# Patient Record
Sex: Female | Born: 1940 | Race: White | Hispanic: No | Marital: Married | Smoking: Never smoker
Health system: Southern US, Community
[De-identification: ages and names within clinical notes are randomized; demographics above are authoritative.]

## PROBLEM LIST (undated history)

## (undated) DIAGNOSIS — E039 Hypothyroidism, unspecified: Secondary | ICD-10-CM

## (undated) HISTORY — PX: COLONOSCOPY: SHX174

## (undated) HISTORY — PX: EYE SURGERY: SHX253

---

## 1983-03-01 HISTORY — PX: ABDOMINAL HYSTERECTOMY: SHX81

## 1997-07-18 ENCOUNTER — Other Ambulatory Visit: Admission: RE | Admit: 1997-07-18 | Discharge: 1997-07-18 | Payer: Self-pay | Admitting: *Deleted

## 1998-05-11 ENCOUNTER — Ambulatory Visit (HOSPITAL_COMMUNITY): Admission: RE | Admit: 1998-05-11 | Discharge: 1998-05-11 | Payer: Self-pay | Admitting: Obstetrics & Gynecology

## 1999-05-20 ENCOUNTER — Other Ambulatory Visit: Admission: RE | Admit: 1999-05-20 | Discharge: 1999-05-20 | Payer: Self-pay | Admitting: *Deleted

## 2000-01-25 ENCOUNTER — Encounter: Admission: RE | Admit: 2000-01-25 | Discharge: 2000-01-25 | Payer: Self-pay | Admitting: *Deleted

## 2000-01-25 ENCOUNTER — Encounter: Payer: Self-pay | Admitting: *Deleted

## 2001-01-30 ENCOUNTER — Encounter: Admission: RE | Admit: 2001-01-30 | Discharge: 2001-01-30 | Payer: Self-pay | Admitting: *Deleted

## 2001-01-30 ENCOUNTER — Encounter: Payer: Self-pay | Admitting: *Deleted

## 2001-09-14 ENCOUNTER — Encounter: Payer: Self-pay | Admitting: *Deleted

## 2001-09-14 ENCOUNTER — Encounter: Admission: RE | Admit: 2001-09-14 | Discharge: 2001-09-14 | Payer: Self-pay | Admitting: *Deleted

## 2002-05-23 ENCOUNTER — Encounter: Admission: RE | Admit: 2002-05-23 | Discharge: 2002-05-23 | Payer: Self-pay | Admitting: Internal Medicine

## 2002-05-23 ENCOUNTER — Encounter: Payer: Self-pay | Admitting: Internal Medicine

## 2003-05-27 ENCOUNTER — Encounter: Admission: RE | Admit: 2003-05-27 | Discharge: 2003-05-27 | Payer: Self-pay | Admitting: *Deleted

## 2004-06-29 ENCOUNTER — Encounter: Admission: RE | Admit: 2004-06-29 | Discharge: 2004-06-29 | Payer: Self-pay | Admitting: *Deleted

## 2005-07-19 ENCOUNTER — Encounter: Admission: RE | Admit: 2005-07-19 | Discharge: 2005-07-19 | Payer: Self-pay | Admitting: Emergency Medicine

## 2006-08-14 ENCOUNTER — Encounter: Admission: RE | Admit: 2006-08-14 | Discharge: 2006-08-14 | Payer: Self-pay | Admitting: Obstetrics & Gynecology

## 2006-08-28 ENCOUNTER — Encounter: Admission: RE | Admit: 2006-08-28 | Discharge: 2006-08-28 | Payer: Self-pay | Admitting: Obstetrics & Gynecology

## 2007-10-10 ENCOUNTER — Encounter: Admission: RE | Admit: 2007-10-10 | Discharge: 2007-10-10 | Payer: Self-pay | Admitting: Obstetrics & Gynecology

## 2008-01-11 ENCOUNTER — Encounter: Admission: RE | Admit: 2008-01-11 | Discharge: 2008-01-11 | Payer: Self-pay | Admitting: Obstetrics & Gynecology

## 2008-12-18 ENCOUNTER — Encounter: Admission: RE | Admit: 2008-12-18 | Discharge: 2008-12-18 | Payer: Self-pay | Admitting: Obstetrics & Gynecology

## 2010-01-15 ENCOUNTER — Encounter: Admission: RE | Admit: 2010-01-15 | Discharge: 2010-01-15 | Payer: Self-pay | Admitting: Obstetrics & Gynecology

## 2010-12-27 ENCOUNTER — Other Ambulatory Visit: Payer: Self-pay | Admitting: Internal Medicine

## 2010-12-27 DIAGNOSIS — Z1231 Encounter for screening mammogram for malignant neoplasm of breast: Secondary | ICD-10-CM

## 2011-01-17 ENCOUNTER — Ambulatory Visit
Admission: RE | Admit: 2011-01-17 | Discharge: 2011-01-17 | Disposition: A | Discharge status: Home / Self Care | Payer: Medicare Other | Source: Ambulatory Visit | Attending: Internal Medicine | Admitting: Internal Medicine

## 2011-01-17 DIAGNOSIS — Z1231 Encounter for screening mammogram for malignant neoplasm of breast: Secondary | ICD-10-CM

## 2011-10-18 DIAGNOSIS — L408 Other psoriasis: Secondary | ICD-10-CM | POA: Diagnosis not present

## 2011-10-18 DIAGNOSIS — D485 Neoplasm of uncertain behavior of skin: Secondary | ICD-10-CM | POA: Diagnosis not present

## 2011-10-18 DIAGNOSIS — L820 Inflamed seborrheic keratosis: Secondary | ICD-10-CM | POA: Diagnosis not present

## 2011-12-27 ENCOUNTER — Other Ambulatory Visit: Payer: Self-pay | Admitting: Internal Medicine

## 2011-12-27 DIAGNOSIS — Z1231 Encounter for screening mammogram for malignant neoplasm of breast: Secondary | ICD-10-CM

## 2012-01-19 DIAGNOSIS — H35379 Puckering of macula, unspecified eye: Secondary | ICD-10-CM | POA: Diagnosis not present

## 2012-01-19 DIAGNOSIS — Z961 Presence of intraocular lens: Secondary | ICD-10-CM | POA: Diagnosis not present

## 2012-02-02 ENCOUNTER — Ambulatory Visit
Admission: RE | Admit: 2012-02-02 | Discharge: 2012-02-02 | Disposition: A | Discharge status: Home / Self Care | Payer: Medicare Other | Source: Ambulatory Visit | Attending: Internal Medicine | Admitting: Internal Medicine

## 2012-02-02 DIAGNOSIS — Z1231 Encounter for screening mammogram for malignant neoplasm of breast: Secondary | ICD-10-CM

## 2012-02-06 DIAGNOSIS — Z961 Presence of intraocular lens: Secondary | ICD-10-CM | POA: Diagnosis not present

## 2012-02-06 DIAGNOSIS — H35379 Puckering of macula, unspecified eye: Secondary | ICD-10-CM | POA: Diagnosis not present

## 2012-02-08 DIAGNOSIS — Z0000 Encounter for general adult medical examination without abnormal findings: Secondary | ICD-10-CM | POA: Diagnosis not present

## 2012-02-08 DIAGNOSIS — E785 Hyperlipidemia, unspecified: Secondary | ICD-10-CM | POA: Diagnosis not present

## 2012-02-08 DIAGNOSIS — Z1331 Encounter for screening for depression: Secondary | ICD-10-CM | POA: Diagnosis not present

## 2012-06-18 DIAGNOSIS — Z961 Presence of intraocular lens: Secondary | ICD-10-CM | POA: Diagnosis not present

## 2012-06-18 DIAGNOSIS — H26499 Other secondary cataract, unspecified eye: Secondary | ICD-10-CM | POA: Diagnosis not present

## 2012-06-20 DIAGNOSIS — H26499 Other secondary cataract, unspecified eye: Secondary | ICD-10-CM | POA: Diagnosis not present

## 2012-11-08 DIAGNOSIS — M542 Cervicalgia: Secondary | ICD-10-CM | POA: Diagnosis not present

## 2012-11-08 DIAGNOSIS — M25539 Pain in unspecified wrist: Secondary | ICD-10-CM | POA: Diagnosis not present

## 2012-11-08 DIAGNOSIS — M19039 Primary osteoarthritis, unspecified wrist: Secondary | ICD-10-CM | POA: Diagnosis not present

## 2012-11-08 DIAGNOSIS — M5030 Other cervical disc degeneration, unspecified cervical region: Secondary | ICD-10-CM | POA: Diagnosis not present

## 2012-11-08 DIAGNOSIS — M25519 Pain in unspecified shoulder: Secondary | ICD-10-CM | POA: Diagnosis not present

## 2012-11-14 DIAGNOSIS — M25529 Pain in unspecified elbow: Secondary | ICD-10-CM | POA: Diagnosis not present

## 2012-11-14 DIAGNOSIS — M542 Cervicalgia: Secondary | ICD-10-CM | POA: Diagnosis not present

## 2012-11-16 DIAGNOSIS — M25519 Pain in unspecified shoulder: Secondary | ICD-10-CM | POA: Diagnosis not present

## 2012-11-16 DIAGNOSIS — M25539 Pain in unspecified wrist: Secondary | ICD-10-CM | POA: Diagnosis not present

## 2012-11-16 DIAGNOSIS — IMO0001 Reserved for inherently not codable concepts without codable children: Secondary | ICD-10-CM | POA: Diagnosis not present

## 2012-11-16 DIAGNOSIS — M542 Cervicalgia: Secondary | ICD-10-CM | POA: Diagnosis not present

## 2012-11-19 DIAGNOSIS — M25529 Pain in unspecified elbow: Secondary | ICD-10-CM | POA: Diagnosis not present

## 2012-11-19 DIAGNOSIS — M542 Cervicalgia: Secondary | ICD-10-CM | POA: Diagnosis not present

## 2012-11-21 DIAGNOSIS — M542 Cervicalgia: Secondary | ICD-10-CM | POA: Diagnosis not present

## 2012-11-21 DIAGNOSIS — M25529 Pain in unspecified elbow: Secondary | ICD-10-CM | POA: Diagnosis not present

## 2012-11-23 DIAGNOSIS — M542 Cervicalgia: Secondary | ICD-10-CM | POA: Diagnosis not present

## 2012-11-23 DIAGNOSIS — M25529 Pain in unspecified elbow: Secondary | ICD-10-CM | POA: Diagnosis not present

## 2012-11-26 DIAGNOSIS — M5412 Radiculopathy, cervical region: Secondary | ICD-10-CM | POA: Diagnosis not present

## 2012-11-26 DIAGNOSIS — M542 Cervicalgia: Secondary | ICD-10-CM | POA: Diagnosis not present

## 2012-11-27 DIAGNOSIS — M542 Cervicalgia: Secondary | ICD-10-CM | POA: Diagnosis not present

## 2012-11-27 DIAGNOSIS — M25529 Pain in unspecified elbow: Secondary | ICD-10-CM | POA: Diagnosis not present

## 2012-11-27 DIAGNOSIS — M25539 Pain in unspecified wrist: Secondary | ICD-10-CM | POA: Diagnosis not present

## 2012-11-29 DIAGNOSIS — M542 Cervicalgia: Secondary | ICD-10-CM | POA: Diagnosis not present

## 2012-11-29 DIAGNOSIS — M25529 Pain in unspecified elbow: Secondary | ICD-10-CM | POA: Diagnosis not present

## 2012-12-06 ENCOUNTER — Other Ambulatory Visit: Payer: Self-pay | Admitting: Gastroenterology

## 2012-12-07 DIAGNOSIS — M25519 Pain in unspecified shoulder: Secondary | ICD-10-CM | POA: Diagnosis not present

## 2012-12-07 DIAGNOSIS — E039 Hypothyroidism, unspecified: Secondary | ICD-10-CM | POA: Diagnosis not present

## 2012-12-07 DIAGNOSIS — M25539 Pain in unspecified wrist: Secondary | ICD-10-CM | POA: Diagnosis not present

## 2012-12-07 DIAGNOSIS — R946 Abnormal results of thyroid function studies: Secondary | ICD-10-CM | POA: Diagnosis not present

## 2012-12-24 ENCOUNTER — Other Ambulatory Visit: Payer: Self-pay

## 2012-12-24 DIAGNOSIS — Z1231 Encounter for screening mammogram for malignant neoplasm of breast: Secondary | ICD-10-CM

## 2013-01-15 ENCOUNTER — Encounter (HOSPITAL_COMMUNITY): Payer: Self-pay | Admitting: *Deleted

## 2013-01-16 ENCOUNTER — Encounter (HOSPITAL_COMMUNITY): Payer: Self-pay | Admitting: Pharmacy Technician

## 2013-01-22 DIAGNOSIS — Z1211 Encounter for screening for malignant neoplasm of colon: Secondary | ICD-10-CM | POA: Diagnosis not present

## 2013-01-22 DIAGNOSIS — Z86010 Personal history of colonic polyps: Secondary | ICD-10-CM | POA: Diagnosis not present

## 2013-01-29 ENCOUNTER — Encounter (HOSPITAL_COMMUNITY)
Admission: RE | Disposition: A | Discharge status: Home / Self Care | Payer: Self-pay | Source: Ambulatory Visit | Attending: Gastroenterology

## 2013-01-29 ENCOUNTER — Ambulatory Visit (HOSPITAL_COMMUNITY)
Admission: RE | Admit: 2013-01-29 | Discharge: 2013-01-29 | Disposition: A | Discharge status: Home / Self Care | Payer: Medicare Other | Source: Ambulatory Visit | Attending: Gastroenterology | Admitting: Gastroenterology

## 2013-01-29 ENCOUNTER — Encounter (HOSPITAL_COMMUNITY): Payer: Self-pay | Admitting: *Deleted

## 2013-01-29 ENCOUNTER — Encounter (HOSPITAL_COMMUNITY): Payer: Medicare Other | Admitting: Anesthesiology

## 2013-01-29 ENCOUNTER — Ambulatory Visit (HOSPITAL_COMMUNITY): Payer: Medicare Other | Admitting: Anesthesiology

## 2013-01-29 DIAGNOSIS — E7800 Pure hypercholesterolemia, unspecified: Secondary | ICD-10-CM | POA: Insufficient documentation

## 2013-01-29 DIAGNOSIS — Z09 Encounter for follow-up examination after completed treatment for conditions other than malignant neoplasm: Secondary | ICD-10-CM | POA: Insufficient documentation

## 2013-01-29 DIAGNOSIS — Z860100 Personal history of colon polyps, unspecified: Secondary | ICD-10-CM | POA: Diagnosis not present

## 2013-01-29 HISTORY — DX: Hypothyroidism, unspecified: E03.9

## 2013-01-29 HISTORY — PX: COLONOSCOPY WITH PROPOFOL: SHX5780

## 2013-01-29 SURGERY — COLONOSCOPY WITH PROPOFOL
Anesthesia: Monitor Anesthesia Care

## 2013-01-29 MED ORDER — PROPOFOL INFUSION 10 MG/ML OPTIME
INTRAVENOUS | Status: DC | PRN
  Administered 2013-01-29: 140 ug/kg/min via INTRAVENOUS

## 2013-01-29 MED ORDER — PROPOFOL 10 MG/ML IV BOLUS
INTRAVENOUS | Status: AC
  Filled 2013-01-29: qty 20

## 2013-01-29 MED ORDER — PROPOFOL 10 MG/ML IV BOLUS
INTRAVENOUS | Status: DC | PRN
  Administered 2013-01-29: 100 mg via INTRAVENOUS

## 2013-01-29 MED ORDER — SODIUM CHLORIDE 0.9 % IV SOLN: INTRAVENOUS | Status: DC

## 2013-01-29 MED ORDER — LACTATED RINGERS IV SOLN
INTRAVENOUS | Status: DC | PRN
  Administered 2013-01-29: 11:00:00 via INTRAVENOUS

## 2013-01-29 SURGICAL SUPPLY — 21 items

## 2013-01-29 NOTE — Op Note (Signed)
Procedure: Surveillance colonoscopy. Personal history of adenomatous colon polyps  Endoscopist: Danise Edge  Premedication: Propofol administered by anesthesia  Procedure: Anal inspection and digital rectal exam were normal. The Pentax pediatric colonoscope was introduced into the rectum and advanced to the cecum. A normal-appearing appendiceal orifice and ileocecal valve were identified. Colonic preparation for the exam today was good. Performance of the colonoscopy today was extremely difficult secondary to a looping colon  Rectum. Normal. Retroflexed view of the distal rectum normal.  Sigmoid colon and descending colon. Normal  Splenic flexure. Normal  Transverse colon. Normal  Hepatic flexure. Normal  Ascending colon. Normal  Cecum and ileocecal valve. Normal  Assessment: Normal surveillance proctocolonoscopy to the cecum

## 2013-01-29 NOTE — Anesthesia Preprocedure Evaluation (Addendum)
Anesthesia Evaluation  Patient identified by MRN, date of birth, ID band Patient awake    Reviewed: Allergy & Precautions, H&P , NPO status , Patient's Chart, lab work & pertinent test results  Airway Mallampati: II TM Distance: >3 FB Neck ROM: Full    Dental  (+) Dental Advisory Given and Teeth Intact   Pulmonary neg pulmonary ROS,  breath sounds clear to auscultation        Cardiovascular negative cardio ROS  Rhythm:Regular Rate:Normal     Neuro/Psych negative neurological ROS  negative psych ROS   GI/Hepatic negative GI ROS, Neg liver ROS,   Endo/Other  negative endocrine ROSHypothyroidism   Renal/GU negative Renal ROS     Musculoskeletal negative musculoskeletal ROS (+)   Abdominal   Peds  Hematology negative hematology ROS (+)   Anesthesia Other Findings   Reproductive/Obstetrics negative OB ROS                          Anesthesia Physical Anesthesia Plan  ASA: II  Anesthesia Plan: MAC   Post-op Pain Management:    Induction: Intravenous  Airway Management Planned:   Additional Equipment:   Intra-op Plan:   Post-operative Plan:   Informed Consent: I have reviewed the patients History and Physical, chart, labs and discussed the procedure including the risks, benefits and alternatives for the proposed anesthesia with the patient or authorized representative who has indicated his/her understanding and acceptance.   Dental advisory given  Plan Discussed with: CRNA  Anesthesia Plan Comments:         Anesthesia Quick Evaluation

## 2013-01-29 NOTE — Transfer of Care (Signed)
Immediate Anesthesia Transfer of Care Note  Patient: Veronica Webb  Procedure(s) Performed: Procedure(s) (LRB): COLONOSCOPY WITH PROPOFOL (N/A)  Patient Location: PACU  Anesthesia Type: MAC  Level of Consciousness: sedated, patient cooperative and responds to stimulation  Airway & Oxygen Therapy: Patient Spontanous Breathing and Patient connected to face mask oxgen  Post-op Assessment: Report given to PACU RN and Post -op Vital signs reviewed and stable  Post vital signs: Reviewed and stable  Complications: No apparent anesthesia complications

## 2013-01-29 NOTE — Preoperative (Signed)
Beta Blockers   Reason not to administer Beta Blockers:Not Applicable 

## 2013-01-29 NOTE — H&P (Signed)
  Procedure: Surveillance colonoscopy. Small adenomatous colon polyps removed colonoscopically in August  2006. Normal surveillance colonoscopy in August 2009.  History: The patient is a 72 year old female born Jul 05, 1940. On 10/06/2004 the patient underwent a colonoscopy with removal of two 5 mm adenomatous colon polyps. On 10/09/2007 she underwent a normal surveillance colonoscopy. The patient is scheduled to undergo a repeat surveillance colonoscopy today.  Medication allergies: None  Past medical history: Hypercholesterolemia. Cervical disc disease. Allergic rhinitis. Hysterectomy.  Exam: The patient is alert and lying comfortably on the endoscopy stretcher. Lungs are clear to auscultation. Cardiac exam reveals a regular rhythm. Abdomen is soft and nontender to palpation.  Plan: Proceed with surveillance colonoscopy using propofol sedation.

## 2013-01-30 ENCOUNTER — Encounter (HOSPITAL_COMMUNITY): Payer: Self-pay | Admitting: Gastroenterology

## 2013-01-30 NOTE — Anesthesia Postprocedure Evaluation (Signed)
Anesthesia Post Note  Patient: Veronica Webb  Procedure(s) Performed: Procedure(s) (LRB): COLONOSCOPY WITH PROPOFOL (N/A)  Anesthesia type: MAC  Patient location: PACU  Post pain: Pain level controlled  Post assessment: Post-op Vital signs reviewed  Last Vitals: BP 79/43  Pulse 62  Temp(Src) 35.9 C (Oral)  Resp 17  Ht 5\' 5"  (1.651 m)  Wt 153 lb (69.4 kg)  BMI 25.46 kg/m2  SpO2 96%  Post vital signs: Reviewed  Level of consciousness: awake  Complications: No apparent anesthesia complications

## 2013-01-31 DIAGNOSIS — H35379 Puckering of macula, unspecified eye: Secondary | ICD-10-CM | POA: Diagnosis not present

## 2013-01-31 DIAGNOSIS — Z961 Presence of intraocular lens: Secondary | ICD-10-CM | POA: Diagnosis not present

## 2013-02-04 ENCOUNTER — Ambulatory Visit: Payer: Medicare Other

## 2013-02-08 ENCOUNTER — Ambulatory Visit
Admission: RE | Admit: 2013-02-08 | Discharge: 2013-02-08 | Disposition: A | Discharge status: Home / Self Care | Payer: Medicare Other | Source: Ambulatory Visit

## 2013-02-08 DIAGNOSIS — E785 Hyperlipidemia, unspecified: Secondary | ICD-10-CM | POA: Diagnosis not present

## 2013-02-08 DIAGNOSIS — E559 Vitamin D deficiency, unspecified: Secondary | ICD-10-CM | POA: Diagnosis not present

## 2013-02-08 DIAGNOSIS — Z1331 Encounter for screening for depression: Secondary | ICD-10-CM | POA: Diagnosis not present

## 2013-02-08 DIAGNOSIS — M47812 Spondylosis without myelopathy or radiculopathy, cervical region: Secondary | ICD-10-CM | POA: Diagnosis not present

## 2013-02-08 DIAGNOSIS — E039 Hypothyroidism, unspecified: Secondary | ICD-10-CM | POA: Diagnosis not present

## 2013-02-08 DIAGNOSIS — Z803 Family history of malignant neoplasm of breast: Secondary | ICD-10-CM | POA: Diagnosis not present

## 2013-02-08 DIAGNOSIS — Z1231 Encounter for screening mammogram for malignant neoplasm of breast: Secondary | ICD-10-CM

## 2013-02-08 DIAGNOSIS — Z23 Encounter for immunization: Secondary | ICD-10-CM | POA: Diagnosis not present

## 2013-05-14 DIAGNOSIS — E785 Hyperlipidemia, unspecified: Secondary | ICD-10-CM | POA: Diagnosis not present

## 2014-01-06 ENCOUNTER — Other Ambulatory Visit: Payer: Self-pay

## 2014-01-06 DIAGNOSIS — Z1231 Encounter for screening mammogram for malignant neoplasm of breast: Secondary | ICD-10-CM

## 2014-02-06 DIAGNOSIS — H35372 Puckering of macula, left eye: Secondary | ICD-10-CM | POA: Diagnosis not present

## 2014-02-06 DIAGNOSIS — Z961 Presence of intraocular lens: Secondary | ICD-10-CM | POA: Diagnosis not present

## 2014-02-10 ENCOUNTER — Ambulatory Visit
Admission: RE | Admit: 2014-02-10 | Discharge: 2014-02-10 | Disposition: A | Discharge status: Home / Self Care | Payer: Medicare Other | Source: Ambulatory Visit

## 2014-02-10 DIAGNOSIS — Z1231 Encounter for screening mammogram for malignant neoplasm of breast: Secondary | ICD-10-CM | POA: Diagnosis not present

## 2014-02-11 DIAGNOSIS — I10 Essential (primary) hypertension: Secondary | ICD-10-CM | POA: Diagnosis not present

## 2014-02-11 DIAGNOSIS — E780 Pure hypercholesterolemia: Secondary | ICD-10-CM | POA: Diagnosis not present

## 2014-02-11 DIAGNOSIS — Z0000 Encounter for general adult medical examination without abnormal findings: Secondary | ICD-10-CM | POA: Diagnosis not present

## 2014-02-11 DIAGNOSIS — Z1389 Encounter for screening for other disorder: Secondary | ICD-10-CM | POA: Diagnosis not present

## 2014-02-11 DIAGNOSIS — Z23 Encounter for immunization: Secondary | ICD-10-CM | POA: Diagnosis not present

## 2014-02-11 DIAGNOSIS — E039 Hypothyroidism, unspecified: Secondary | ICD-10-CM | POA: Diagnosis not present

## 2014-03-03 DIAGNOSIS — R05 Cough: Secondary | ICD-10-CM | POA: Diagnosis not present

## 2014-04-07 DIAGNOSIS — I10 Essential (primary) hypertension: Secondary | ICD-10-CM | POA: Diagnosis not present

## 2014-04-07 DIAGNOSIS — L918 Other hypertrophic disorders of the skin: Secondary | ICD-10-CM | POA: Diagnosis not present

## 2014-04-21 DIAGNOSIS — L821 Other seborrheic keratosis: Secondary | ICD-10-CM | POA: Diagnosis not present

## 2014-04-21 DIAGNOSIS — D492 Neoplasm of unspecified behavior of bone, soft tissue, and skin: Secondary | ICD-10-CM | POA: Diagnosis not present

## 2014-04-21 DIAGNOSIS — D485 Neoplasm of uncertain behavior of skin: Secondary | ICD-10-CM | POA: Diagnosis not present

## 2014-04-21 DIAGNOSIS — I10 Essential (primary) hypertension: Secondary | ICD-10-CM | POA: Diagnosis not present

## 2014-05-08 DIAGNOSIS — Z13820 Encounter for screening for osteoporosis: Secondary | ICD-10-CM | POA: Diagnosis not present

## 2014-05-08 DIAGNOSIS — N951 Menopausal and female climacteric states: Secondary | ICD-10-CM | POA: Diagnosis not present

## 2015-01-05 ENCOUNTER — Other Ambulatory Visit: Payer: Self-pay

## 2015-01-05 DIAGNOSIS — Z1231 Encounter for screening mammogram for malignant neoplasm of breast: Secondary | ICD-10-CM

## 2015-02-12 ENCOUNTER — Ambulatory Visit: Payer: BLUE CROSS/BLUE SHIELD

## 2015-02-12 DIAGNOSIS — Z1389 Encounter for screening for other disorder: Secondary | ICD-10-CM | POA: Diagnosis not present

## 2015-02-12 DIAGNOSIS — Z23 Encounter for immunization: Secondary | ICD-10-CM | POA: Diagnosis not present

## 2015-02-12 DIAGNOSIS — Z961 Presence of intraocular lens: Secondary | ICD-10-CM | POA: Diagnosis not present

## 2015-02-12 DIAGNOSIS — E039 Hypothyroidism, unspecified: Secondary | ICD-10-CM | POA: Diagnosis not present

## 2015-02-12 DIAGNOSIS — R030 Elevated blood-pressure reading, without diagnosis of hypertension: Secondary | ICD-10-CM | POA: Diagnosis not present

## 2015-02-12 DIAGNOSIS — F329 Major depressive disorder, single episode, unspecified: Secondary | ICD-10-CM | POA: Diagnosis not present

## 2015-02-12 DIAGNOSIS — H35372 Puckering of macula, left eye: Secondary | ICD-10-CM | POA: Diagnosis not present

## 2015-02-13 ENCOUNTER — Ambulatory Visit
Admission: RE | Admit: 2015-02-13 | Discharge: 2015-02-13 | Disposition: A | Discharge status: Home / Self Care | Payer: Medicare Other | Source: Ambulatory Visit

## 2015-02-13 DIAGNOSIS — Z1231 Encounter for screening mammogram for malignant neoplasm of breast: Secondary | ICD-10-CM | POA: Diagnosis not present

## 2015-05-04 DIAGNOSIS — F325 Major depressive disorder, single episode, in full remission: Secondary | ICD-10-CM | POA: Diagnosis not present

## 2015-05-04 DIAGNOSIS — R030 Elevated blood-pressure reading, without diagnosis of hypertension: Secondary | ICD-10-CM | POA: Diagnosis not present

## 2016-01-11 ENCOUNTER — Other Ambulatory Visit: Payer: Self-pay | Admitting: Internal Medicine

## 2016-01-11 DIAGNOSIS — Z1231 Encounter for screening mammogram for malignant neoplasm of breast: Secondary | ICD-10-CM

## 2016-02-04 DIAGNOSIS — H35372 Puckering of macula, left eye: Secondary | ICD-10-CM | POA: Diagnosis not present

## 2016-02-04 DIAGNOSIS — Z961 Presence of intraocular lens: Secondary | ICD-10-CM | POA: Diagnosis not present

## 2016-02-04 DIAGNOSIS — H43812 Vitreous degeneration, left eye: Secondary | ICD-10-CM | POA: Diagnosis not present

## 2016-02-09 DIAGNOSIS — Z1389 Encounter for screening for other disorder: Secondary | ICD-10-CM | POA: Diagnosis not present

## 2016-02-09 DIAGNOSIS — E7800 Pure hypercholesterolemia, unspecified: Secondary | ICD-10-CM | POA: Diagnosis not present

## 2016-02-09 DIAGNOSIS — E039 Hypothyroidism, unspecified: Secondary | ICD-10-CM | POA: Diagnosis not present

## 2016-02-09 DIAGNOSIS — Z0000 Encounter for general adult medical examination without abnormal findings: Secondary | ICD-10-CM | POA: Diagnosis not present

## 2016-02-15 ENCOUNTER — Ambulatory Visit
Admission: RE | Admit: 2016-02-15 | Discharge: 2016-02-15 | Disposition: A | Discharge status: Home / Self Care | Payer: Medicare Other | Source: Ambulatory Visit | Attending: Internal Medicine | Admitting: Internal Medicine

## 2016-02-15 DIAGNOSIS — Z1231 Encounter for screening mammogram for malignant neoplasm of breast: Secondary | ICD-10-CM | POA: Diagnosis not present

## 2016-05-04 DIAGNOSIS — L821 Other seborrheic keratosis: Secondary | ICD-10-CM | POA: Diagnosis not present

## 2016-05-04 DIAGNOSIS — D2271 Melanocytic nevi of right lower limb, including hip: Secondary | ICD-10-CM | POA: Diagnosis not present

## 2016-05-04 DIAGNOSIS — L723 Sebaceous cyst: Secondary | ICD-10-CM | POA: Diagnosis not present

## 2016-05-04 DIAGNOSIS — D2272 Melanocytic nevi of left lower limb, including hip: Secondary | ICD-10-CM | POA: Diagnosis not present

## 2016-05-04 DIAGNOSIS — D225 Melanocytic nevi of trunk: Secondary | ICD-10-CM | POA: Diagnosis not present

## 2016-05-04 DIAGNOSIS — D1801 Hemangioma of skin and subcutaneous tissue: Secondary | ICD-10-CM | POA: Diagnosis not present

## 2016-05-04 DIAGNOSIS — Z85828 Personal history of other malignant neoplasm of skin: Secondary | ICD-10-CM | POA: Diagnosis not present

## 2016-05-04 DIAGNOSIS — L814 Other melanin hyperpigmentation: Secondary | ICD-10-CM | POA: Diagnosis not present

## 2016-05-05 ENCOUNTER — Other Ambulatory Visit: Payer: Self-pay | Admitting: Internal Medicine

## 2016-05-05 ENCOUNTER — Other Ambulatory Visit: Payer: Medicare Other

## 2016-05-05 ENCOUNTER — Ambulatory Visit
Admission: RE | Admit: 2016-05-05 | Discharge: 2016-05-05 | Disposition: A | Discharge status: Home / Self Care | Payer: Medicare Other | Source: Ambulatory Visit | Attending: Internal Medicine | Admitting: Internal Medicine

## 2016-05-05 DIAGNOSIS — M25551 Pain in right hip: Secondary | ICD-10-CM

## 2016-05-05 DIAGNOSIS — M25552 Pain in left hip: Secondary | ICD-10-CM

## 2016-05-09 DIAGNOSIS — M25551 Pain in right hip: Secondary | ICD-10-CM | POA: Diagnosis not present

## 2016-05-14 DIAGNOSIS — M25551 Pain in right hip: Secondary | ICD-10-CM | POA: Diagnosis not present

## 2016-05-23 DIAGNOSIS — M25551 Pain in right hip: Secondary | ICD-10-CM | POA: Diagnosis not present

## 2016-06-06 DIAGNOSIS — M25551 Pain in right hip: Secondary | ICD-10-CM | POA: Diagnosis not present

## 2016-06-08 DIAGNOSIS — M25551 Pain in right hip: Secondary | ICD-10-CM | POA: Diagnosis not present

## 2016-06-10 DIAGNOSIS — M25551 Pain in right hip: Secondary | ICD-10-CM | POA: Diagnosis not present

## 2016-06-13 DIAGNOSIS — M25551 Pain in right hip: Secondary | ICD-10-CM | POA: Diagnosis not present

## 2016-06-15 DIAGNOSIS — M7542 Impingement syndrome of left shoulder: Secondary | ICD-10-CM | POA: Diagnosis not present

## 2016-06-15 DIAGNOSIS — M7541 Impingement syndrome of right shoulder: Secondary | ICD-10-CM | POA: Diagnosis not present

## 2016-06-15 DIAGNOSIS — M25551 Pain in right hip: Secondary | ICD-10-CM | POA: Diagnosis not present

## 2016-06-15 DIAGNOSIS — M5030 Other cervical disc degeneration, unspecified cervical region: Secondary | ICD-10-CM | POA: Diagnosis not present

## 2016-06-16 DIAGNOSIS — M25611 Stiffness of right shoulder, not elsewhere classified: Secondary | ICD-10-CM | POA: Diagnosis not present

## 2016-06-16 DIAGNOSIS — M25512 Pain in left shoulder: Secondary | ICD-10-CM | POA: Diagnosis not present

## 2016-06-16 DIAGNOSIS — M25511 Pain in right shoulder: Secondary | ICD-10-CM | POA: Diagnosis not present

## 2016-06-20 DIAGNOSIS — M25511 Pain in right shoulder: Secondary | ICD-10-CM | POA: Diagnosis not present

## 2016-06-20 DIAGNOSIS — M25512 Pain in left shoulder: Secondary | ICD-10-CM | POA: Diagnosis not present

## 2016-06-20 DIAGNOSIS — M25611 Stiffness of right shoulder, not elsewhere classified: Secondary | ICD-10-CM | POA: Diagnosis not present

## 2016-06-21 DIAGNOSIS — M25611 Stiffness of right shoulder, not elsewhere classified: Secondary | ICD-10-CM | POA: Diagnosis not present

## 2016-06-21 DIAGNOSIS — M25511 Pain in right shoulder: Secondary | ICD-10-CM | POA: Diagnosis not present

## 2016-06-21 DIAGNOSIS — M25512 Pain in left shoulder: Secondary | ICD-10-CM | POA: Diagnosis not present

## 2016-06-22 DIAGNOSIS — M25512 Pain in left shoulder: Secondary | ICD-10-CM | POA: Diagnosis not present

## 2016-06-22 DIAGNOSIS — M25611 Stiffness of right shoulder, not elsewhere classified: Secondary | ICD-10-CM | POA: Diagnosis not present

## 2016-06-22 DIAGNOSIS — M25511 Pain in right shoulder: Secondary | ICD-10-CM | POA: Diagnosis not present

## 2016-07-01 DIAGNOSIS — M25511 Pain in right shoulder: Secondary | ICD-10-CM | POA: Diagnosis not present

## 2016-07-01 DIAGNOSIS — M25611 Stiffness of right shoulder, not elsewhere classified: Secondary | ICD-10-CM | POA: Diagnosis not present

## 2016-07-01 DIAGNOSIS — M25512 Pain in left shoulder: Secondary | ICD-10-CM | POA: Diagnosis not present

## 2016-07-04 DIAGNOSIS — M25511 Pain in right shoulder: Secondary | ICD-10-CM | POA: Diagnosis not present

## 2016-07-04 DIAGNOSIS — M25512 Pain in left shoulder: Secondary | ICD-10-CM | POA: Diagnosis not present

## 2016-07-04 DIAGNOSIS — M25611 Stiffness of right shoulder, not elsewhere classified: Secondary | ICD-10-CM | POA: Diagnosis not present

## 2016-07-05 DIAGNOSIS — M25611 Stiffness of right shoulder, not elsewhere classified: Secondary | ICD-10-CM | POA: Diagnosis not present

## 2016-07-05 DIAGNOSIS — M25512 Pain in left shoulder: Secondary | ICD-10-CM | POA: Diagnosis not present

## 2016-07-05 DIAGNOSIS — M25511 Pain in right shoulder: Secondary | ICD-10-CM | POA: Diagnosis not present

## 2016-07-08 DIAGNOSIS — M25611 Stiffness of right shoulder, not elsewhere classified: Secondary | ICD-10-CM | POA: Diagnosis not present

## 2016-07-08 DIAGNOSIS — M25512 Pain in left shoulder: Secondary | ICD-10-CM | POA: Diagnosis not present

## 2016-07-08 DIAGNOSIS — M25511 Pain in right shoulder: Secondary | ICD-10-CM | POA: Diagnosis not present

## 2016-07-11 DIAGNOSIS — M25511 Pain in right shoulder: Secondary | ICD-10-CM | POA: Diagnosis not present

## 2016-07-11 DIAGNOSIS — M25512 Pain in left shoulder: Secondary | ICD-10-CM | POA: Diagnosis not present

## 2016-07-11 DIAGNOSIS — M25611 Stiffness of right shoulder, not elsewhere classified: Secondary | ICD-10-CM | POA: Diagnosis not present

## 2016-07-12 DIAGNOSIS — M25611 Stiffness of right shoulder, not elsewhere classified: Secondary | ICD-10-CM | POA: Diagnosis not present

## 2016-07-12 DIAGNOSIS — M25512 Pain in left shoulder: Secondary | ICD-10-CM | POA: Diagnosis not present

## 2016-07-12 DIAGNOSIS — M25511 Pain in right shoulder: Secondary | ICD-10-CM | POA: Diagnosis not present

## 2016-07-15 DIAGNOSIS — M25512 Pain in left shoulder: Secondary | ICD-10-CM | POA: Diagnosis not present

## 2016-07-15 DIAGNOSIS — M25511 Pain in right shoulder: Secondary | ICD-10-CM | POA: Diagnosis not present

## 2016-07-15 DIAGNOSIS — M25611 Stiffness of right shoulder, not elsewhere classified: Secondary | ICD-10-CM | POA: Diagnosis not present

## 2016-07-18 DIAGNOSIS — M25511 Pain in right shoulder: Secondary | ICD-10-CM | POA: Diagnosis not present

## 2016-07-18 DIAGNOSIS — Z961 Presence of intraocular lens: Secondary | ICD-10-CM | POA: Diagnosis not present

## 2016-07-18 DIAGNOSIS — M25512 Pain in left shoulder: Secondary | ICD-10-CM | POA: Diagnosis not present

## 2016-07-18 DIAGNOSIS — M25611 Stiffness of right shoulder, not elsewhere classified: Secondary | ICD-10-CM | POA: Diagnosis not present

## 2016-07-19 DIAGNOSIS — M25611 Stiffness of right shoulder, not elsewhere classified: Secondary | ICD-10-CM | POA: Diagnosis not present

## 2016-07-19 DIAGNOSIS — M25511 Pain in right shoulder: Secondary | ICD-10-CM | POA: Diagnosis not present

## 2016-07-19 DIAGNOSIS — M25512 Pain in left shoulder: Secondary | ICD-10-CM | POA: Diagnosis not present

## 2016-07-20 DIAGNOSIS — M25511 Pain in right shoulder: Secondary | ICD-10-CM | POA: Diagnosis not present

## 2016-07-28 DIAGNOSIS — M25611 Stiffness of right shoulder, not elsewhere classified: Secondary | ICD-10-CM | POA: Diagnosis not present

## 2016-07-28 DIAGNOSIS — M25512 Pain in left shoulder: Secondary | ICD-10-CM | POA: Diagnosis not present

## 2016-07-28 DIAGNOSIS — M25511 Pain in right shoulder: Secondary | ICD-10-CM | POA: Diagnosis not present

## 2016-08-04 DIAGNOSIS — M25512 Pain in left shoulder: Secondary | ICD-10-CM | POA: Diagnosis not present

## 2016-08-04 DIAGNOSIS — M25611 Stiffness of right shoulder, not elsewhere classified: Secondary | ICD-10-CM | POA: Diagnosis not present

## 2016-08-04 DIAGNOSIS — M25511 Pain in right shoulder: Secondary | ICD-10-CM | POA: Diagnosis not present

## 2016-08-09 DIAGNOSIS — M25512 Pain in left shoulder: Secondary | ICD-10-CM | POA: Diagnosis not present

## 2016-08-09 DIAGNOSIS — M25611 Stiffness of right shoulder, not elsewhere classified: Secondary | ICD-10-CM | POA: Diagnosis not present

## 2016-08-09 DIAGNOSIS — M25511 Pain in right shoulder: Secondary | ICD-10-CM | POA: Diagnosis not present

## 2016-08-15 DIAGNOSIS — M25512 Pain in left shoulder: Secondary | ICD-10-CM | POA: Diagnosis not present

## 2016-08-15 DIAGNOSIS — M25611 Stiffness of right shoulder, not elsewhere classified: Secondary | ICD-10-CM | POA: Diagnosis not present

## 2016-08-15 DIAGNOSIS — M25511 Pain in right shoulder: Secondary | ICD-10-CM | POA: Diagnosis not present

## 2016-08-23 DIAGNOSIS — M25512 Pain in left shoulder: Secondary | ICD-10-CM | POA: Diagnosis not present

## 2016-08-23 DIAGNOSIS — M25611 Stiffness of right shoulder, not elsewhere classified: Secondary | ICD-10-CM | POA: Diagnosis not present

## 2016-08-23 DIAGNOSIS — M25511 Pain in right shoulder: Secondary | ICD-10-CM | POA: Diagnosis not present

## 2016-08-29 DIAGNOSIS — M25512 Pain in left shoulder: Secondary | ICD-10-CM | POA: Diagnosis not present

## 2016-08-29 DIAGNOSIS — M25611 Stiffness of right shoulder, not elsewhere classified: Secondary | ICD-10-CM | POA: Diagnosis not present

## 2016-08-29 DIAGNOSIS — M25511 Pain in right shoulder: Secondary | ICD-10-CM | POA: Diagnosis not present

## 2016-09-02 DIAGNOSIS — M25511 Pain in right shoulder: Secondary | ICD-10-CM | POA: Diagnosis not present

## 2016-09-02 DIAGNOSIS — M25512 Pain in left shoulder: Secondary | ICD-10-CM | POA: Diagnosis not present

## 2016-09-02 DIAGNOSIS — M25611 Stiffness of right shoulder, not elsewhere classified: Secondary | ICD-10-CM | POA: Diagnosis not present

## 2017-01-11 ENCOUNTER — Other Ambulatory Visit: Payer: Self-pay | Admitting: Internal Medicine

## 2017-01-11 DIAGNOSIS — Z139 Encounter for screening, unspecified: Secondary | ICD-10-CM

## 2017-02-07 DIAGNOSIS — Z1389 Encounter for screening for other disorder: Secondary | ICD-10-CM | POA: Diagnosis not present

## 2017-02-07 DIAGNOSIS — Z0000 Encounter for general adult medical examination without abnormal findings: Secondary | ICD-10-CM | POA: Diagnosis not present

## 2017-02-07 DIAGNOSIS — E039 Hypothyroidism, unspecified: Secondary | ICD-10-CM | POA: Diagnosis not present

## 2017-02-07 DIAGNOSIS — Z23 Encounter for immunization: Secondary | ICD-10-CM | POA: Diagnosis not present

## 2017-02-07 DIAGNOSIS — E7800 Pure hypercholesterolemia, unspecified: Secondary | ICD-10-CM | POA: Diagnosis not present

## 2017-05-01 ENCOUNTER — Ambulatory Visit: Payer: Medicare Other

## 2017-05-04 ENCOUNTER — Ambulatory Visit
Admission: RE | Admit: 2017-05-04 | Discharge: 2017-05-04 | Disposition: A | Discharge status: Home / Self Care | Payer: Medicare Other | Source: Ambulatory Visit | Attending: Internal Medicine | Admitting: Internal Medicine

## 2017-05-04 DIAGNOSIS — Z1231 Encounter for screening mammogram for malignant neoplasm of breast: Secondary | ICD-10-CM | POA: Diagnosis not present

## 2017-05-04 DIAGNOSIS — Z139 Encounter for screening, unspecified: Secondary | ICD-10-CM

## 2017-06-07 DIAGNOSIS — R197 Diarrhea, unspecified: Secondary | ICD-10-CM | POA: Diagnosis not present

## 2017-06-08 DIAGNOSIS — H35372 Puckering of macula, left eye: Secondary | ICD-10-CM | POA: Diagnosis not present

## 2017-06-08 DIAGNOSIS — H43812 Vitreous degeneration, left eye: Secondary | ICD-10-CM | POA: Diagnosis not present

## 2017-06-08 DIAGNOSIS — Z961 Presence of intraocular lens: Secondary | ICD-10-CM | POA: Diagnosis not present

## 2017-06-08 DIAGNOSIS — H35342 Macular cyst, hole, or pseudohole, left eye: Secondary | ICD-10-CM | POA: Diagnosis not present

## 2017-06-15 DIAGNOSIS — D2262 Melanocytic nevi of left upper limb, including shoulder: Secondary | ICD-10-CM | POA: Diagnosis not present

## 2017-06-15 DIAGNOSIS — L718 Other rosacea: Secondary | ICD-10-CM | POA: Diagnosis not present

## 2017-06-15 DIAGNOSIS — Z85828 Personal history of other malignant neoplasm of skin: Secondary | ICD-10-CM | POA: Diagnosis not present

## 2017-06-15 DIAGNOSIS — D2261 Melanocytic nevi of right upper limb, including shoulder: Secondary | ICD-10-CM | POA: Diagnosis not present

## 2017-06-15 DIAGNOSIS — L218 Other seborrheic dermatitis: Secondary | ICD-10-CM | POA: Diagnosis not present

## 2017-06-15 DIAGNOSIS — D1801 Hemangioma of skin and subcutaneous tissue: Secondary | ICD-10-CM | POA: Diagnosis not present

## 2017-06-15 DIAGNOSIS — D2239 Melanocytic nevi of other parts of face: Secondary | ICD-10-CM | POA: Diagnosis not present

## 2017-06-15 DIAGNOSIS — L821 Other seborrheic keratosis: Secondary | ICD-10-CM | POA: Diagnosis not present

## 2018-01-08 DIAGNOSIS — M7061 Trochanteric bursitis, right hip: Secondary | ICD-10-CM | POA: Diagnosis not present

## 2018-02-09 DIAGNOSIS — M7061 Trochanteric bursitis, right hip: Secondary | ICD-10-CM | POA: Diagnosis not present

## 2018-03-09 DIAGNOSIS — R197 Diarrhea, unspecified: Secondary | ICD-10-CM | POA: Diagnosis not present

## 2018-03-09 DIAGNOSIS — F325 Major depressive disorder, single episode, in full remission: Secondary | ICD-10-CM | POA: Diagnosis not present

## 2018-03-09 DIAGNOSIS — Z1389 Encounter for screening for other disorder: Secondary | ICD-10-CM | POA: Diagnosis not present

## 2018-03-09 DIAGNOSIS — E039 Hypothyroidism, unspecified: Secondary | ICD-10-CM | POA: Diagnosis not present

## 2018-03-09 DIAGNOSIS — Z0000 Encounter for general adult medical examination without abnormal findings: Secondary | ICD-10-CM | POA: Diagnosis not present

## 2018-03-09 DIAGNOSIS — E7800 Pure hypercholesterolemia, unspecified: Secondary | ICD-10-CM | POA: Diagnosis not present

## 2018-03-26 ENCOUNTER — Other Ambulatory Visit: Payer: Self-pay | Admitting: Internal Medicine

## 2018-03-26 DIAGNOSIS — Z1231 Encounter for screening mammogram for malignant neoplasm of breast: Secondary | ICD-10-CM

## 2018-05-07 ENCOUNTER — Ambulatory Visit
Admission: RE | Admit: 2018-05-07 | Discharge: 2018-05-07 | Disposition: A | Discharge status: Home / Self Care | Payer: Medicare Other | Source: Ambulatory Visit | Attending: Internal Medicine | Admitting: Internal Medicine

## 2018-05-07 DIAGNOSIS — Z1231 Encounter for screening mammogram for malignant neoplasm of breast: Secondary | ICD-10-CM

## 2018-07-05 DIAGNOSIS — L218 Other seborrheic dermatitis: Secondary | ICD-10-CM | POA: Diagnosis not present

## 2018-07-05 DIAGNOSIS — L814 Other melanin hyperpigmentation: Secondary | ICD-10-CM | POA: Diagnosis not present

## 2018-07-05 DIAGNOSIS — D2371 Other benign neoplasm of skin of right lower limb, including hip: Secondary | ICD-10-CM | POA: Diagnosis not present

## 2018-07-05 DIAGNOSIS — L820 Inflamed seborrheic keratosis: Secondary | ICD-10-CM | POA: Diagnosis not present

## 2018-07-05 DIAGNOSIS — Z85828 Personal history of other malignant neoplasm of skin: Secondary | ICD-10-CM | POA: Diagnosis not present

## 2018-07-05 DIAGNOSIS — L821 Other seborrheic keratosis: Secondary | ICD-10-CM | POA: Diagnosis not present

## 2018-07-05 DIAGNOSIS — L718 Other rosacea: Secondary | ICD-10-CM | POA: Diagnosis not present

## 2018-07-05 DIAGNOSIS — D1801 Hemangioma of skin and subcutaneous tissue: Secondary | ICD-10-CM | POA: Diagnosis not present

## 2018-07-05 DIAGNOSIS — D2239 Melanocytic nevi of other parts of face: Secondary | ICD-10-CM | POA: Diagnosis not present

## 2018-08-28 IMAGING — CR DG HIP (WITH OR WITHOUT PELVIS) 2-3V*R*
3 series · 3 of 3 positions shown · non-contrast
Comparison: None in PACs

CLINICAL DATA: Chronic right hip pain which is increasing in
severity.

EXAM:
DG HIP (WITH OR WITHOUT PELVIS) 2-3V RIGHT

[t pelvis a.p.]
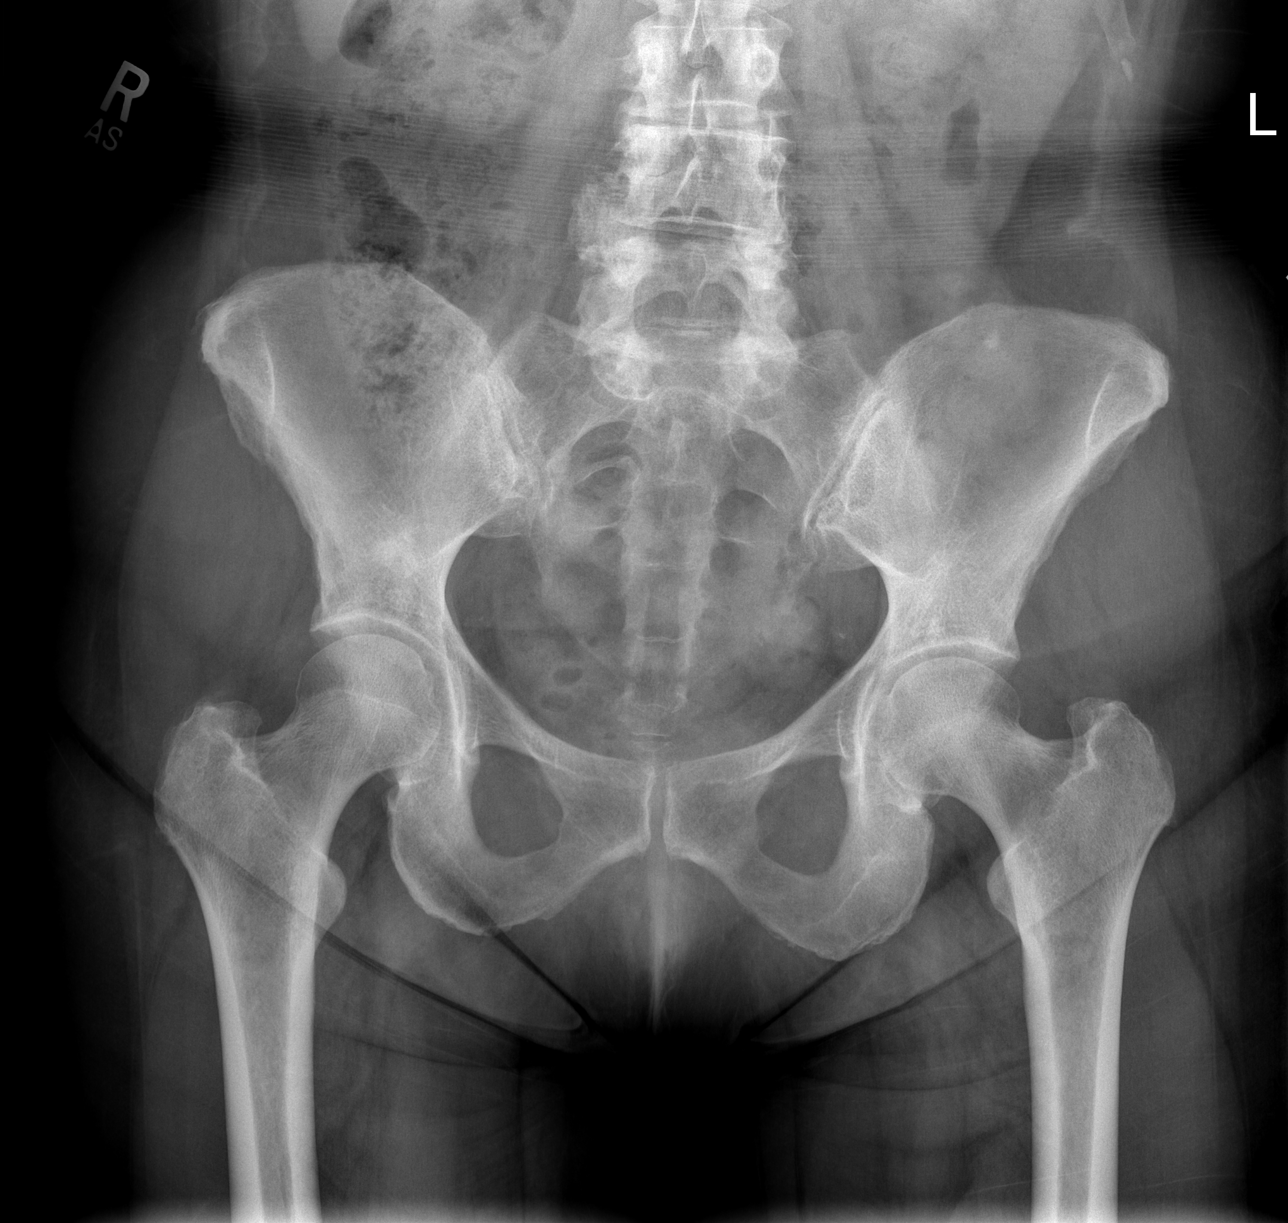

[t hip ap right]
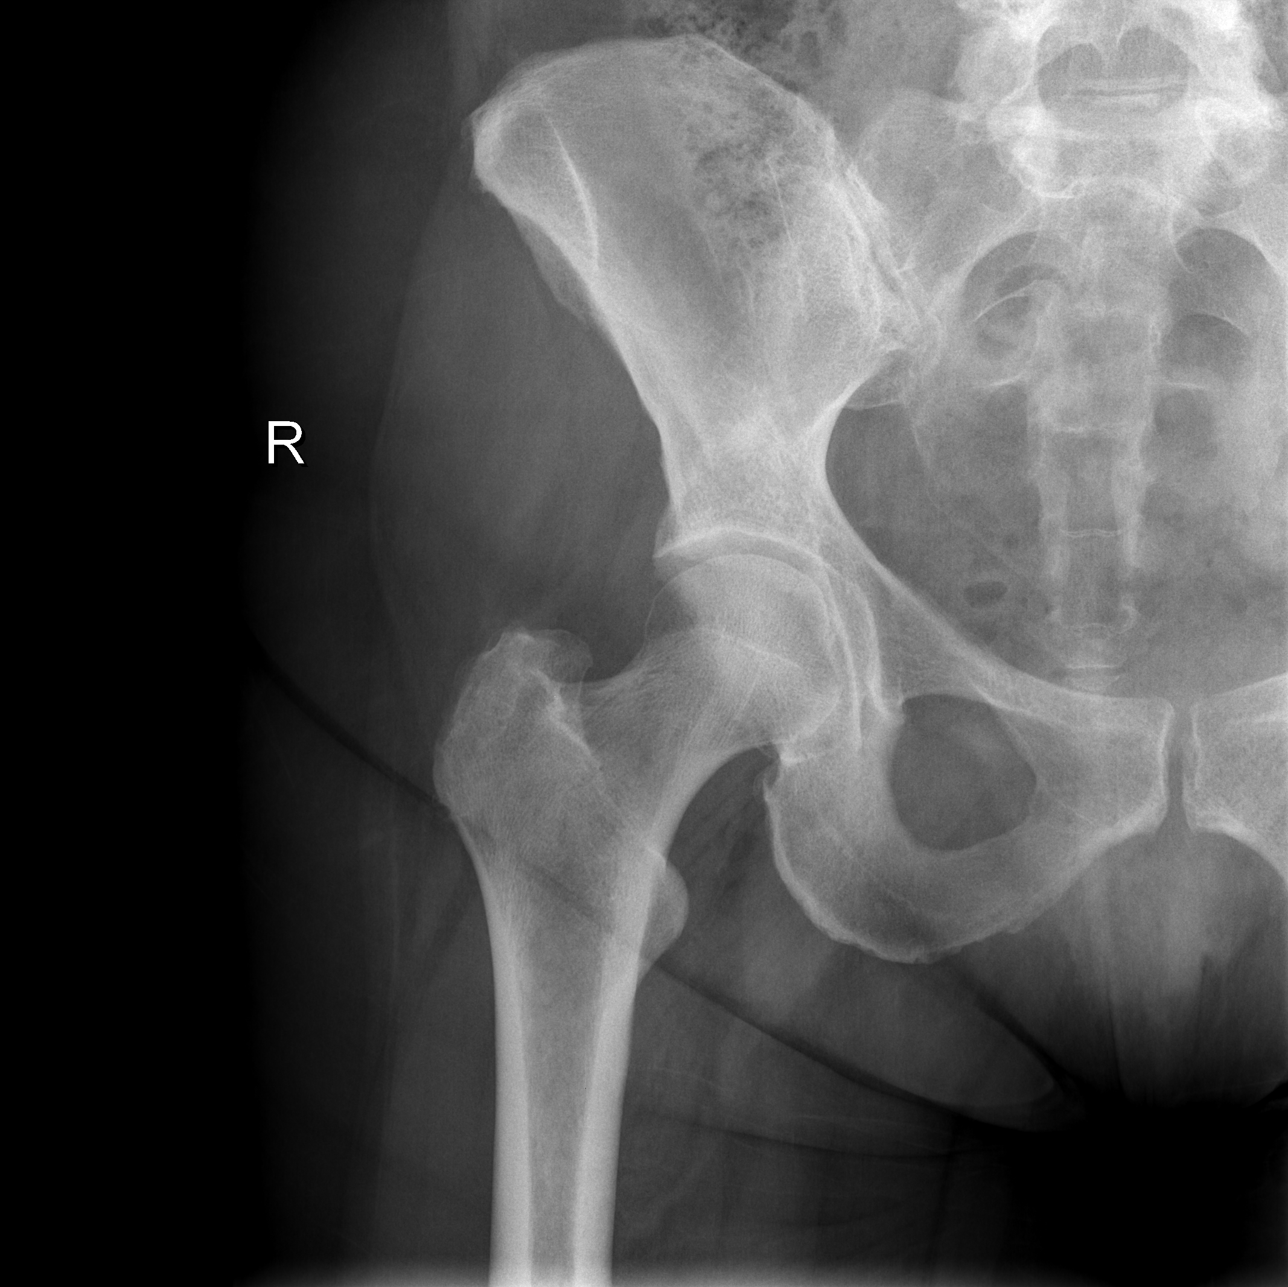

[t hip frog leg right]
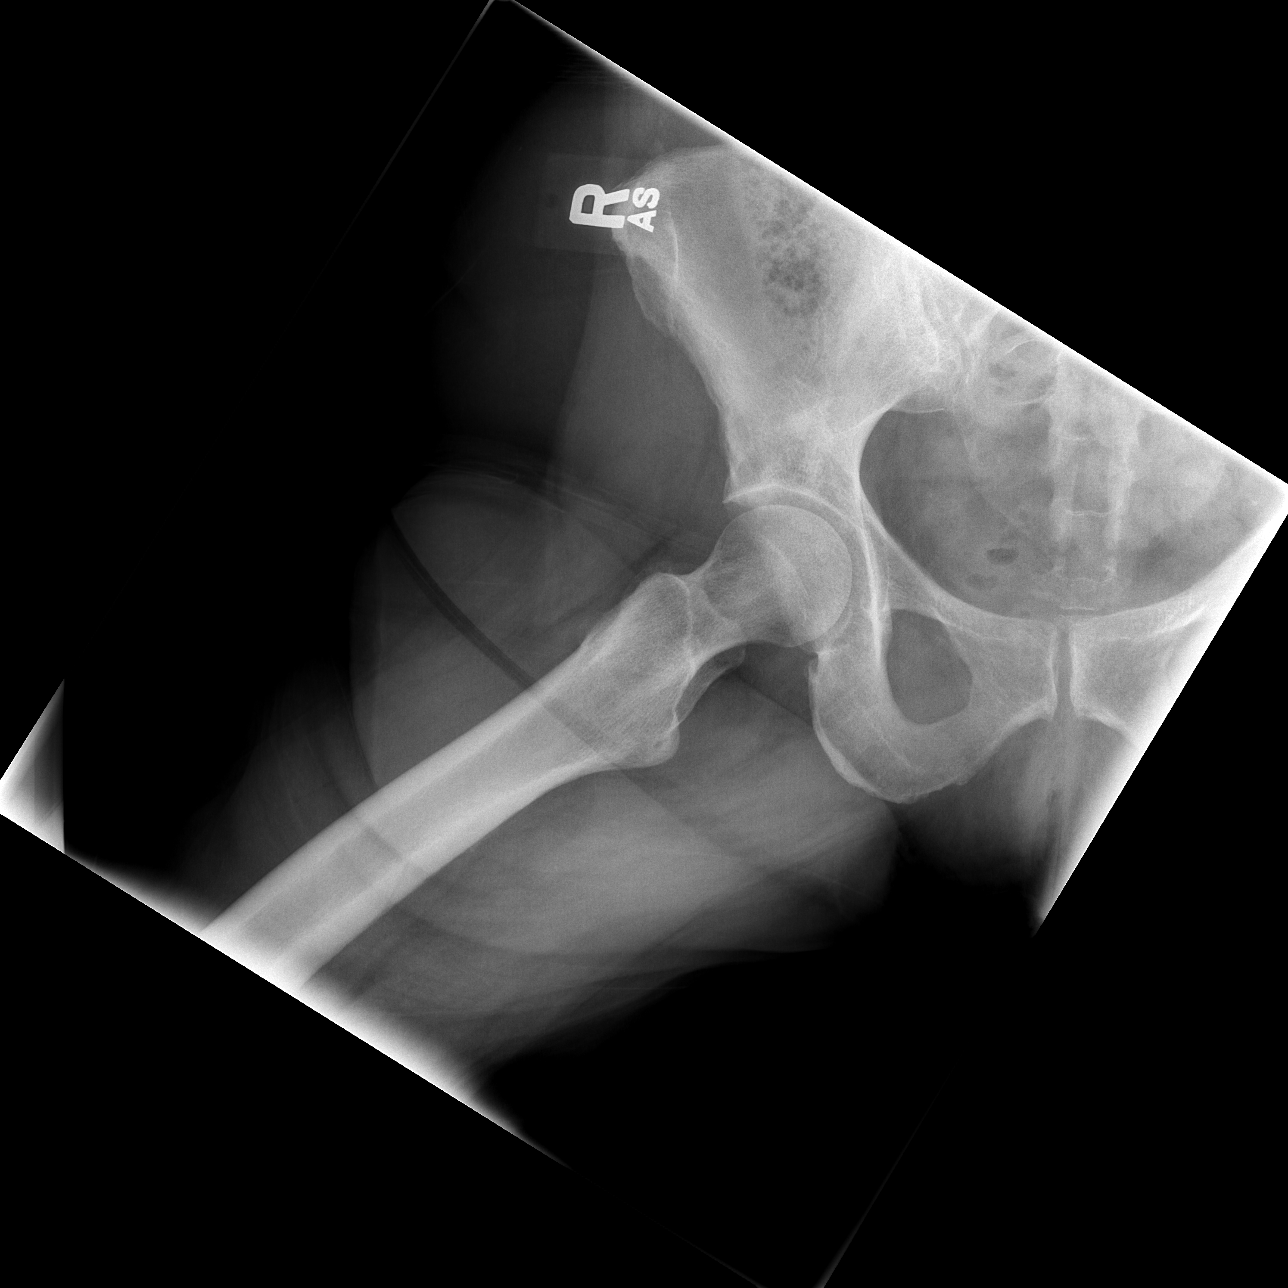

[3 of 3 positions shown; findings below may reference images not displayed]

FINDINGS: The bony pelvis is subjectively adequately mineralized. The right
hip joint space is well maintained. AP and lateral views of the
right hip reveal the articular surfaces of the femoral head and
acetabulum to be smoothly rounded. The femoral neck,
intertrochanteric, and subtrochanteric regions are normal.
IMPRESSION: There is no acute or significant chronic bony abnormality of the
right hip.

## 2018-12-07 DIAGNOSIS — Z23 Encounter for immunization: Secondary | ICD-10-CM | POA: Diagnosis not present

## 2019-01-08 DIAGNOSIS — R194 Change in bowel habit: Secondary | ICD-10-CM | POA: Diagnosis not present

## 2019-01-16 DIAGNOSIS — H35372 Puckering of macula, left eye: Secondary | ICD-10-CM | POA: Diagnosis not present

## 2019-01-16 DIAGNOSIS — H524 Presbyopia: Secondary | ICD-10-CM | POA: Diagnosis not present

## 2019-01-16 DIAGNOSIS — H5203 Hypermetropia, bilateral: Secondary | ICD-10-CM | POA: Diagnosis not present

## 2019-01-16 DIAGNOSIS — Z961 Presence of intraocular lens: Secondary | ICD-10-CM | POA: Diagnosis not present

## 2019-03-18 DIAGNOSIS — Z1389 Encounter for screening for other disorder: Secondary | ICD-10-CM | POA: Diagnosis not present

## 2019-03-18 DIAGNOSIS — Z0000 Encounter for general adult medical examination without abnormal findings: Secondary | ICD-10-CM | POA: Diagnosis not present

## 2019-03-18 DIAGNOSIS — E039 Hypothyroidism, unspecified: Secondary | ICD-10-CM | POA: Diagnosis not present

## 2019-03-18 DIAGNOSIS — K5900 Constipation, unspecified: Secondary | ICD-10-CM | POA: Diagnosis not present

## 2019-03-18 DIAGNOSIS — E7800 Pure hypercholesterolemia, unspecified: Secondary | ICD-10-CM | POA: Diagnosis not present

## 2019-03-18 DIAGNOSIS — M79672 Pain in left foot: Secondary | ICD-10-CM | POA: Diagnosis not present

## 2019-03-19 ENCOUNTER — Ambulatory Visit
Admission: RE | Admit: 2019-03-19 | Discharge: 2019-03-19 | Disposition: A | Discharge status: Home / Self Care | Payer: Medicare Other | Source: Ambulatory Visit | Attending: Internal Medicine | Admitting: Internal Medicine

## 2019-03-19 ENCOUNTER — Other Ambulatory Visit: Payer: Self-pay | Admitting: Internal Medicine

## 2019-03-19 DIAGNOSIS — R2242 Localized swelling, mass and lump, left lower limb: Secondary | ICD-10-CM | POA: Diagnosis not present

## 2019-03-19 DIAGNOSIS — E039 Hypothyroidism, unspecified: Secondary | ICD-10-CM

## 2019-03-20 ENCOUNTER — Other Ambulatory Visit: Payer: Medicare Other

## 2019-03-20 ENCOUNTER — Ambulatory Visit: Payer: Medicare Other | Attending: Internal Medicine

## 2019-03-20 DIAGNOSIS — Z23 Encounter for immunization: Secondary | ICD-10-CM | POA: Insufficient documentation

## 2019-03-20 NOTE — Progress Notes (Signed)
   Covid-19 Vaccination Clinic  Name:  Veronica Webb    MRN: [id number] DOB: 20-Apr-1940  03/20/2019  Veronica Webb was observed post Covid-19 immunization for 15 minutes without incidence. She was provided with Vaccine Information Sheet and instruction to access the V-Safe system.   Veronica Webb was instructed to call 911 with any severe reactions post vaccine: Marland Kitchen Difficulty breathing  . Swelling of your face and throat  . A fast heartbeat  . A bad rash all over your body  . Dizziness and weakness    Immunizations Administered    Name Date Dose VIS Date Route   Pfizer COVID-19 Vaccine 03/20/2019  2:16 PM 0.3 mL 02/08/2019 Intramuscular   Manufacturer: [place]   Lot: [id number]   [place]: [id number]

## 2019-03-30 ENCOUNTER — Ambulatory Visit: Payer: Medicare Other

## 2019-04-09 ENCOUNTER — Ambulatory Visit: Payer: Medicare Other | Attending: Internal Medicine

## 2019-04-09 DIAGNOSIS — Z23 Encounter for immunization: Secondary | ICD-10-CM | POA: Insufficient documentation

## 2019-04-09 NOTE — Progress Notes (Signed)
   Covid-19 Vaccination Clinic  Name:  Veronica Webb    MRN: [id number] DOB: 1940-03-28  04/09/2019  [name] was observed post Covid-19 immunization for 15 minutes without incidence. She was provided with Vaccine Information Sheet and instruction to access the V-Safe system.   [name] was instructed to call 911 with any severe reactions post vaccine: Marland Kitchen Difficulty breathing  . Swelling of your face and throat  . A fast heartbeat  . A bad rash all over your body  . Dizziness and weakness    Immunizations Administered    Name Date Dose VIS Date Route   Pfizer COVID-19 Vaccine 04/09/2019 12:09 PM 0.3 mL 02/08/2019 Intramuscular   Manufacturer: [place]   Lot: [id number]   [place]: [id number]

## 2019-04-10 ENCOUNTER — Other Ambulatory Visit: Payer: Self-pay | Admitting: Internal Medicine

## 2019-04-10 DIAGNOSIS — Z1231 Encounter for screening mammogram for malignant neoplasm of breast: Secondary | ICD-10-CM

## 2019-05-31 ENCOUNTER — Ambulatory Visit: Payer: Medicare Other

## 2019-06-03 ENCOUNTER — Ambulatory Visit
Admission: RE | Admit: 2019-06-03 | Discharge: 2019-06-03 | Disposition: A | Discharge status: Home / Self Care | Payer: Medicare Other | Source: Ambulatory Visit | Attending: Internal Medicine | Admitting: Internal Medicine

## 2019-06-03 ENCOUNTER — Other Ambulatory Visit: Payer: Self-pay

## 2019-06-03 DIAGNOSIS — Z1231 Encounter for screening mammogram for malignant neoplasm of breast: Secondary | ICD-10-CM

## 2019-09-27 ENCOUNTER — Ambulatory Visit (HOSPITAL_COMMUNITY)
Admission: EM | Admit: 2019-09-27 | Discharge: 2019-09-27 | Disposition: A | Discharge status: Home / Self Care | Payer: Medicare Other | Attending: Emergency Medicine | Admitting: Emergency Medicine

## 2019-09-27 ENCOUNTER — Other Ambulatory Visit: Payer: Self-pay

## 2019-09-27 ENCOUNTER — Encounter (HOSPITAL_COMMUNITY): Payer: Self-pay

## 2019-09-27 DIAGNOSIS — Z20822 Contact with and (suspected) exposure to covid-19: Secondary | ICD-10-CM | POA: Insufficient documentation

## 2019-09-27 LAB — SARS CORONAVIRUS 2 (TAT 6-24 HRS): SARS Coronavirus 2: NEGATIVE

## 2019-09-27 NOTE — ED Triage Notes (Signed)
Pt states she was exposed to COVID positive person last Friday. Denies any URI sx, abdom pain, n/v/d, fever, chills, body aches or SOB.

## 2019-09-27 NOTE — Discharge Instructions (Signed)
We will notify of you any positive findings or if any changes to treatment are needed. If normal or otherwise without concern to your results, we will not call you. Please log on to your MyChart to review your results if interested in so.   

## 2019-09-28 NOTE — ED Provider Notes (Signed)
[facility]    CSN: [id number] Arrival date & time: 09/27/19  0803      History   Chief Complaint Chief Complaint  Patient presents with   covid exposure/testing    HPI Veronica Webb is a 79 y.o. female.   Veronica Webb presents with requests for covid-19 screening. Her son just tested positive, who had been to visit for the weekend. Around her son last on 7/26, just before he tested positive. Denies any symptoms and feels well. Has been vaccinated.        Past Medical History:  Diagnosis Date   Hypothyroidism     There are no problems to display for this patient.   Past Surgical History:  Procedure Laterality Date   ABDOMINAL HYSTERECTOMY  1985   ovaries removed   COLONOSCOPY     COLONOSCOPY WITH PROPOFOL N/A 01/29/2013   Procedure: COLONOSCOPY WITH PROPOFOL;  Surgeon: Garlan Fair, MD;  Location: [facility];  Service: Endoscopy;  Laterality: N/A;   EYE SURGERY     cataract    OB History   No obstetric history on file.      Home Medications    Prior to Admission medications   Medication Sig Start Date End Date Taking? Authorizing Provider  estradiol (ESTRACE) 0.5 MG tablet Take 0.5 mg by mouth daily.   Yes [provider]  levothyroxine (SYNTHROID, LEVOTHROID) 25 MCG tablet Take 25 mcg by mouth daily before breakfast.   Yes [provider]  aspirin EC 81 MG tablet Take 81 mg by mouth daily.    [provider]  atorvastatin (LIPITOR) 10 MG tablet Take 10 mg by mouth every evening.    [provider]  Cholecalciferol (VITAMIN D) 2000 UNITS tablet Take 4,000 Units by mouth daily.    [provider]    Family History History reviewed. No pertinent family history.  Social History Social History   Tobacco Use   Smoking status: Never Smoker   Smokeless tobacco: Never Used  Substance Use Topics   Alcohol use: Yes    Comment:  2 glasses of wind per day   Drug use: No      Allergies   Patient has no known allergies.   Review of Systems Review of Systems   Physical Exam Triage Vital Signs ED Triage Vitals  Enc Vitals Group     BP 09/27/19 0849 (!) 155/83     Pulse Rate 09/27/19 0849 72     Resp 09/27/19 0849 18     Temp 09/27/19 0849 98.2 F (36.8 C)     Temp Source 09/27/19 0849 Oral     SpO2 09/27/19 0849 100 %     Weight --      Height --      Head Circumference --      Peak Flow --      Pain Score 09/27/19 0846 0     Pain Loc --      Pain Edu? --      Excl. in [place]? --    No data found.  Updated Vital Signs BP (!) 155/83 (BP Location: Left Arm)    Pulse 72    Temp 98.2 F (36.8 C) (Oral)    Resp 18    SpO2 100%   Visual Acuity Right Eye Distance:   Left Eye Distance:   Bilateral Distance:    Right Eye Near:   Left Eye Near:    Bilateral Near:  Physical Exam Constitutional:      General: She is not in acute distress.    Appearance: She is well-developed.  Cardiovascular:     Rate and Rhythm: Normal rate.  Pulmonary:     Effort: Pulmonary effort is normal.  Skin:    General: Skin is warm and dry.  Neurological:     Mental Status: She is alert and oriented to person, place, and time.      UC Treatments / Results  Labs (all labs ordered are listed, but only abnormal results are displayed) Labs Reviewed  SARS CORONAVIRUS 2 (TAT 6-24 HRS)    EKG   Radiology No results found.  Procedures Procedures (including critical care time)  Medications Ordered in UC Medications - No data to display  Initial Impression / Assessment and Plan / UC Course  I have reviewed the triage vital signs and the nursing notes.  Pertinent labs & imaging results that were available during my care of the patient were reviewed by me and considered in my medical decision making (see chart for details).     covid screen after exposure. No symptoms. Has been vaccinated. Will notify if positive.  Final Clinical Impressions(s) /  UC Diagnoses   Final diagnoses:  Exposure to COVID-19 virus  Encounter for screening laboratory testing for COVID-19 virus     Discharge Instructions     We will notify of you any positive findings or if any changes to treatment are needed. If normal or otherwise without concern to your results, we will not call you. Please log on to your MyChart to review your results if interested in so.     ED Prescriptions    None     PDMP not reviewed this encounter.   Zigmund Gottron, NP 09/28/19 2303

## 2019-12-05 DIAGNOSIS — Z23 Encounter for immunization: Secondary | ICD-10-CM | POA: Diagnosis not present

## 2020-01-02 DIAGNOSIS — L905 Scar conditions and fibrosis of skin: Secondary | ICD-10-CM | POA: Diagnosis not present

## 2020-01-02 DIAGNOSIS — Z85828 Personal history of other malignant neoplasm of skin: Secondary | ICD-10-CM | POA: Diagnosis not present

## 2020-01-02 DIAGNOSIS — D485 Neoplasm of uncertain behavior of skin: Secondary | ICD-10-CM | POA: Diagnosis not present

## 2020-01-16 DIAGNOSIS — Z961 Presence of intraocular lens: Secondary | ICD-10-CM | POA: Diagnosis not present

## 2020-01-16 DIAGNOSIS — H35372 Puckering of macula, left eye: Secondary | ICD-10-CM | POA: Diagnosis not present

## 2020-03-13 DIAGNOSIS — Z85828 Personal history of other malignant neoplasm of skin: Secondary | ICD-10-CM | POA: Diagnosis not present

## 2020-03-13 DIAGNOSIS — D485 Neoplasm of uncertain behavior of skin: Secondary | ICD-10-CM | POA: Diagnosis not present

## 2020-03-20 DIAGNOSIS — Z0000 Encounter for general adult medical examination without abnormal findings: Secondary | ICD-10-CM | POA: Diagnosis not present

## 2020-03-20 DIAGNOSIS — Z1389 Encounter for screening for other disorder: Secondary | ICD-10-CM | POA: Diagnosis not present

## 2020-03-20 DIAGNOSIS — E039 Hypothyroidism, unspecified: Secondary | ICD-10-CM | POA: Diagnosis not present

## 2020-03-20 DIAGNOSIS — R159 Full incontinence of feces: Secondary | ICD-10-CM | POA: Diagnosis not present

## 2020-03-20 DIAGNOSIS — E7800 Pure hypercholesterolemia, unspecified: Secondary | ICD-10-CM | POA: Diagnosis not present

## 2020-04-21 ENCOUNTER — Other Ambulatory Visit: Payer: Self-pay | Admitting: Internal Medicine

## 2020-04-21 DIAGNOSIS — Z1231 Encounter for screening mammogram for malignant neoplasm of breast: Secondary | ICD-10-CM

## 2020-06-11 ENCOUNTER — Other Ambulatory Visit: Payer: Self-pay

## 2020-06-11 ENCOUNTER — Ambulatory Visit
Admission: RE | Admit: 2020-06-11 | Discharge: 2020-06-11 | Disposition: A | Discharge status: Home / Self Care | Payer: Medicare Other | Source: Ambulatory Visit | Attending: Internal Medicine | Admitting: Internal Medicine

## 2020-06-11 ENCOUNTER — Ambulatory Visit: Payer: Medicare Other

## 2020-06-11 DIAGNOSIS — Z1231 Encounter for screening mammogram for malignant neoplasm of breast: Secondary | ICD-10-CM

## 2020-06-19 DIAGNOSIS — Z23 Encounter for immunization: Secondary | ICD-10-CM | POA: Diagnosis not present

## 2020-07-16 DIAGNOSIS — Z961 Presence of intraocular lens: Secondary | ICD-10-CM | POA: Diagnosis not present

## 2020-07-16 DIAGNOSIS — H35372 Puckering of macula, left eye: Secondary | ICD-10-CM | POA: Diagnosis not present

## 2021-01-25 DIAGNOSIS — H35372 Puckering of macula, left eye: Secondary | ICD-10-CM | POA: Diagnosis not present

## 2021-01-25 DIAGNOSIS — Z961 Presence of intraocular lens: Secondary | ICD-10-CM | POA: Diagnosis not present

## 2021-02-03 DIAGNOSIS — Z03818 Encounter for observation for suspected exposure to other biological agents ruled out: Secondary | ICD-10-CM | POA: Diagnosis not present

## 2021-02-03 DIAGNOSIS — Z20828 Contact with and (suspected) exposure to other viral communicable diseases: Secondary | ICD-10-CM | POA: Diagnosis not present

## 2021-03-10 DIAGNOSIS — L819 Disorder of pigmentation, unspecified: Secondary | ICD-10-CM | POA: Diagnosis not present

## 2021-03-10 DIAGNOSIS — D1801 Hemangioma of skin and subcutaneous tissue: Secondary | ICD-10-CM | POA: Diagnosis not present

## 2021-03-10 DIAGNOSIS — L723 Sebaceous cyst: Secondary | ICD-10-CM | POA: Diagnosis not present

## 2021-03-10 DIAGNOSIS — L821 Other seborrheic keratosis: Secondary | ICD-10-CM | POA: Diagnosis not present

## 2021-03-10 DIAGNOSIS — Z85828 Personal history of other malignant neoplasm of skin: Secondary | ICD-10-CM | POA: Diagnosis not present

## 2021-03-10 DIAGNOSIS — L814 Other melanin hyperpigmentation: Secondary | ICD-10-CM | POA: Diagnosis not present

## 2021-07-11 IMAGING — DX DG FOOT 2V*L*
2 series · 2 of 2 positions shown · non-contrast
Comparison: None.

CLINICAL DATA: Palpable lump of left foot without known injury.

EXAM:
LEFT FOOT - 2 VIEW

[dg foot 2 views left (1 of 2)]
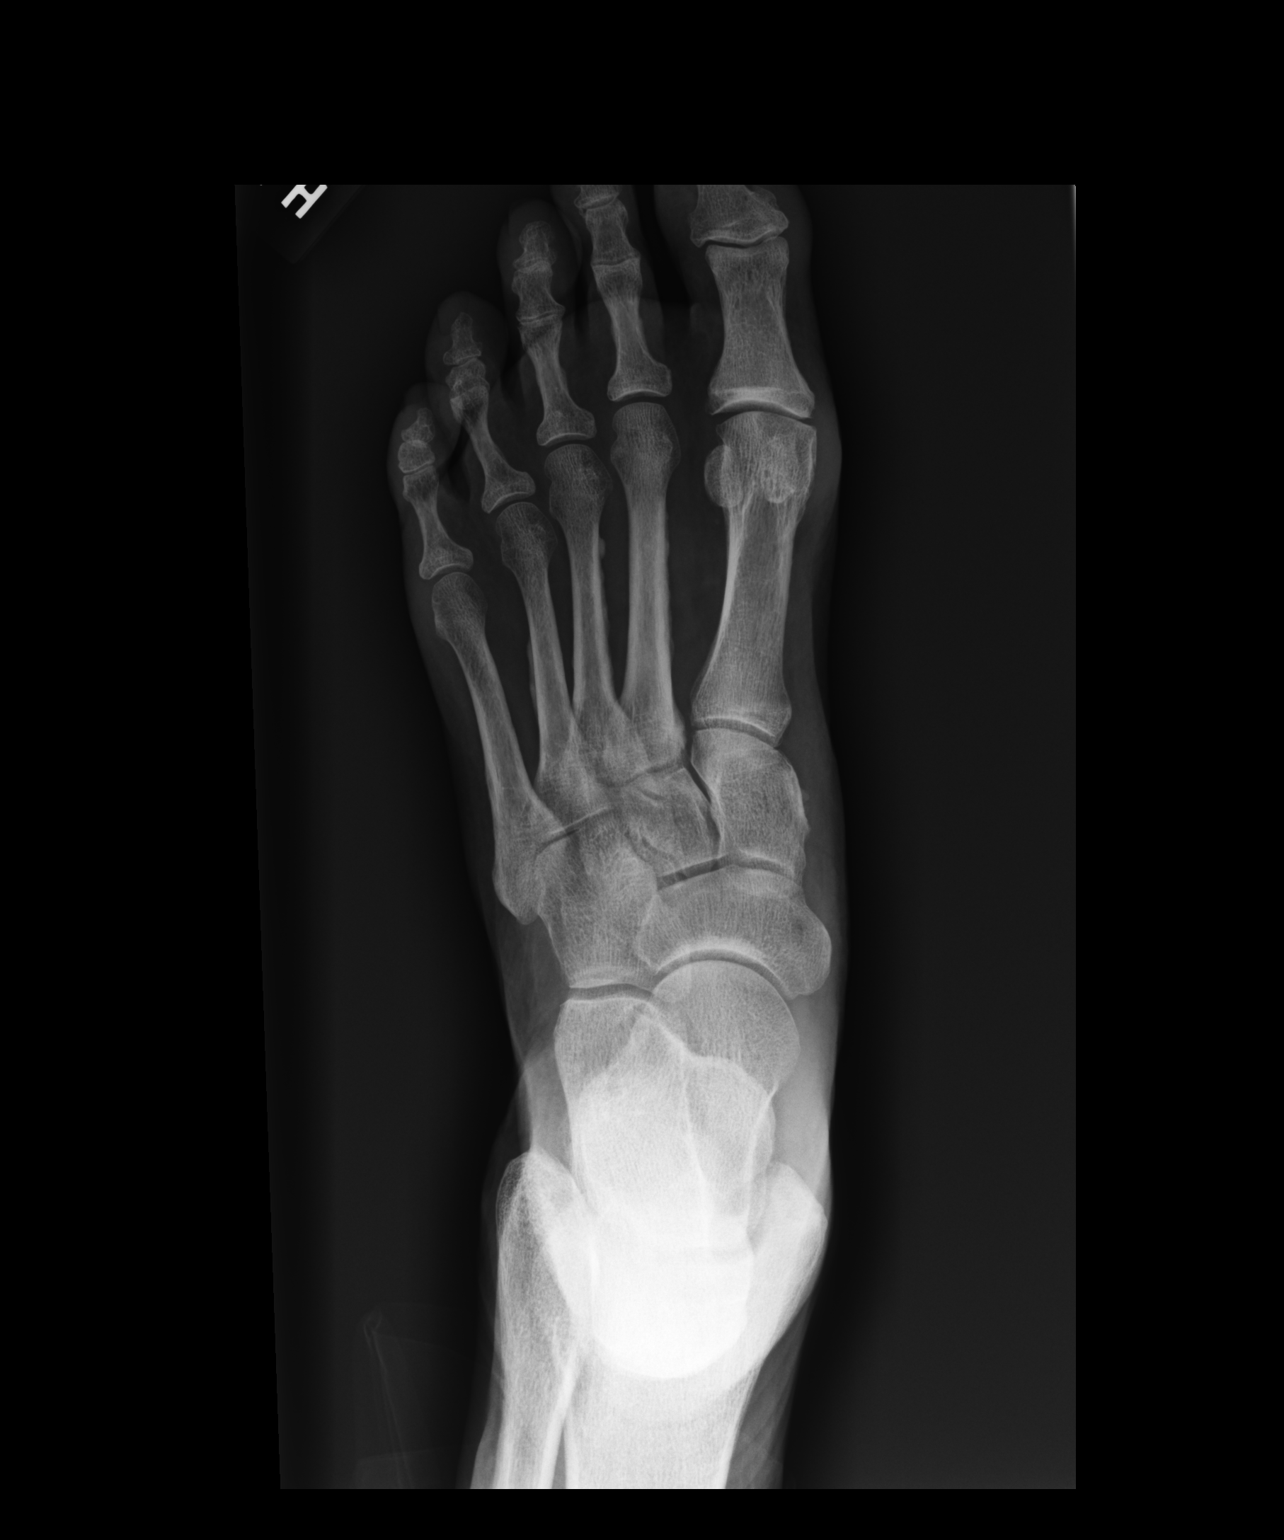

[dg foot 2 views left (2 of 2)]
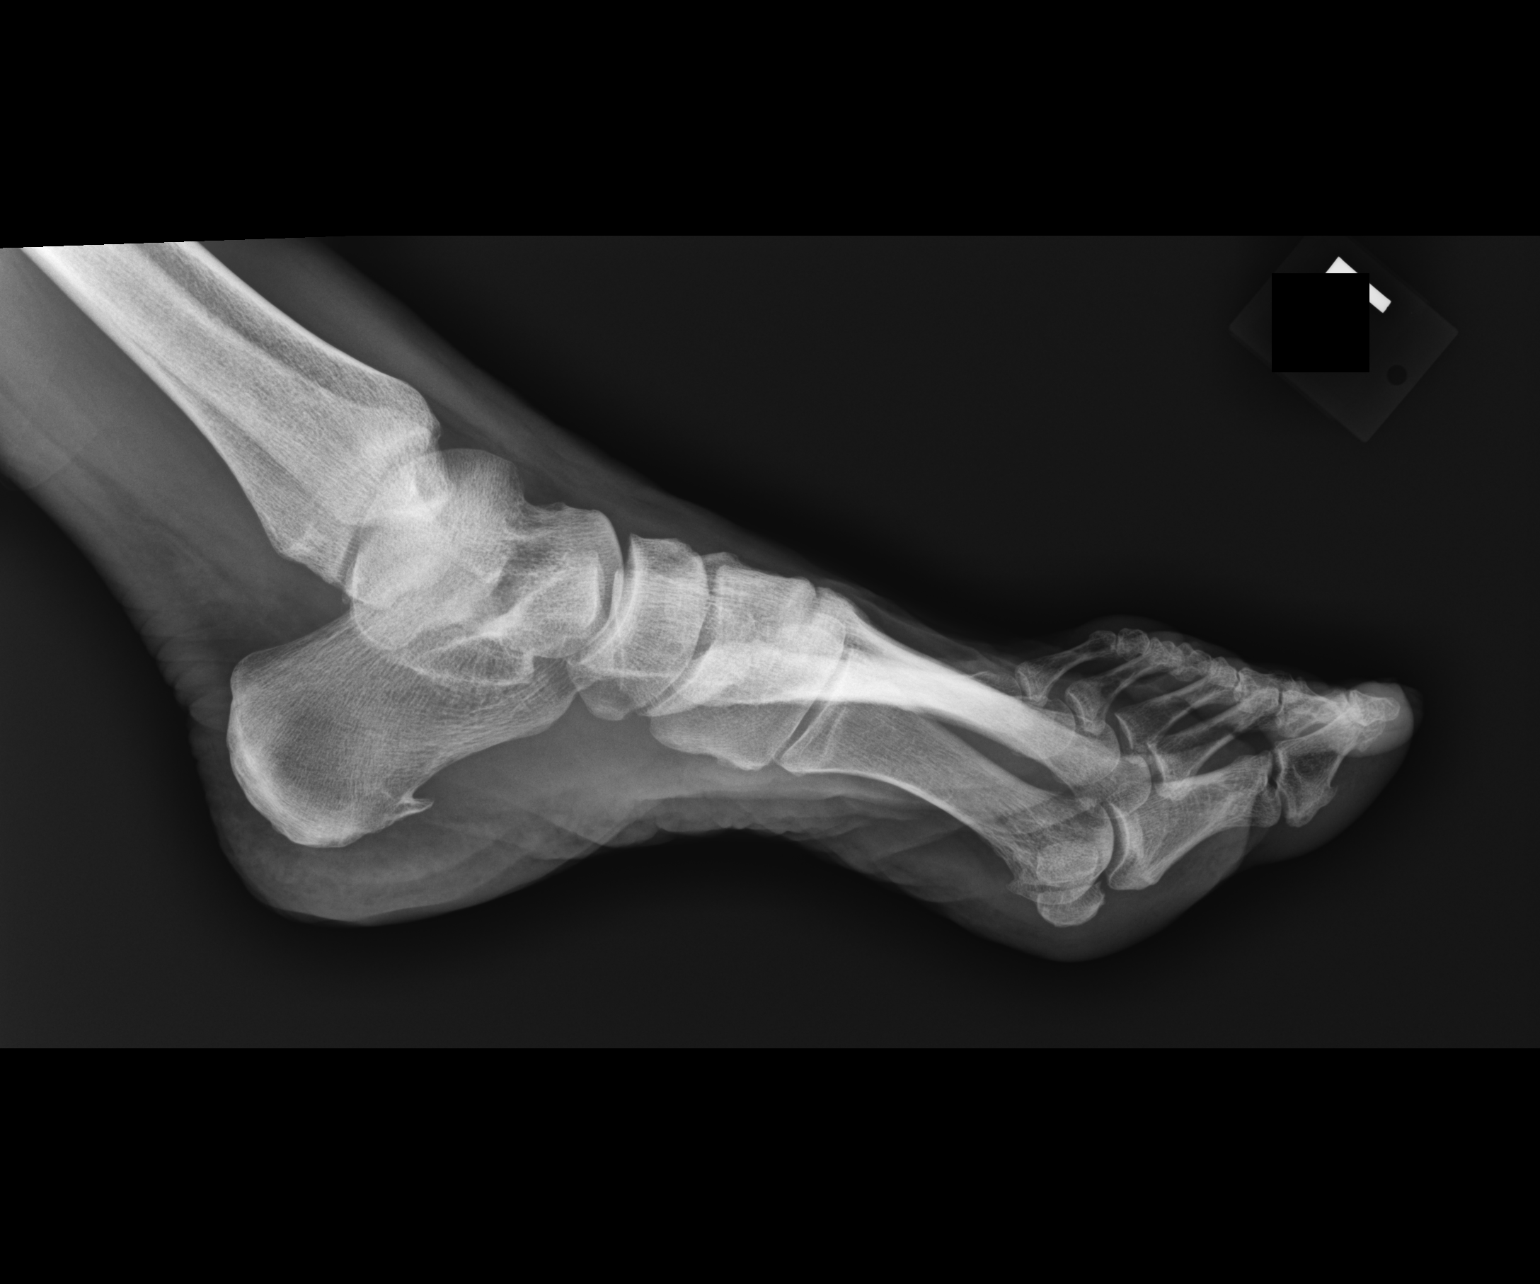

[2 of 2 positions shown; findings below may reference images not displayed]

FINDINGS: There is no evidence of fracture or dislocation. There is no
evidence of arthropathy or other focal bone abnormality. Soft
tissues are unremarkable.
IMPRESSION: Negative.

## 2021-07-16 ENCOUNTER — Other Ambulatory Visit: Payer: Self-pay | Admitting: Internal Medicine

## 2021-07-16 DIAGNOSIS — Z136 Encounter for screening for cardiovascular disorders: Secondary | ICD-10-CM | POA: Diagnosis not present

## 2021-07-16 DIAGNOSIS — Z1231 Encounter for screening mammogram for malignant neoplasm of breast: Secondary | ICD-10-CM

## 2021-07-16 DIAGNOSIS — F325 Major depressive disorder, single episode, in full remission: Secondary | ICD-10-CM | POA: Diagnosis not present

## 2021-07-16 DIAGNOSIS — E039 Hypothyroidism, unspecified: Secondary | ICD-10-CM | POA: Diagnosis not present

## 2021-07-16 DIAGNOSIS — I10 Essential (primary) hypertension: Secondary | ICD-10-CM | POA: Diagnosis not present

## 2021-07-16 DIAGNOSIS — E7800 Pure hypercholesterolemia, unspecified: Secondary | ICD-10-CM | POA: Diagnosis not present

## 2021-07-16 DIAGNOSIS — R195 Other fecal abnormalities: Secondary | ICD-10-CM | POA: Diagnosis not present

## 2021-07-16 DIAGNOSIS — M25561 Pain in right knee: Secondary | ICD-10-CM | POA: Diagnosis not present

## 2021-07-16 DIAGNOSIS — Z0000 Encounter for general adult medical examination without abnormal findings: Secondary | ICD-10-CM | POA: Diagnosis not present

## 2021-07-19 ENCOUNTER — Ambulatory Visit
Admission: RE | Admit: 2021-07-19 | Discharge: 2021-07-19 | Disposition: A | Discharge status: Home / Self Care | Payer: Medicare Other | Source: Ambulatory Visit | Attending: Internal Medicine | Admitting: Internal Medicine

## 2021-07-19 DIAGNOSIS — Z1231 Encounter for screening mammogram for malignant neoplasm of breast: Secondary | ICD-10-CM | POA: Diagnosis not present

## 2021-11-30 ENCOUNTER — Other Ambulatory Visit (HOSPITAL_BASED_OUTPATIENT_CLINIC_OR_DEPARTMENT_OTHER): Payer: Self-pay

## 2021-11-30 DIAGNOSIS — Z23 Encounter for immunization: Secondary | ICD-10-CM | POA: Diagnosis not present

## 2021-11-30 MED ORDER — FLUAD QUADRIVALENT 0.5 ML IM PRSY
PREFILLED_SYRINGE | INTRAMUSCULAR | 0 refills | Status: DC
  Filled 2021-11-30: qty 0.5, 1d supply, fill #0

## 2021-12-22 DIAGNOSIS — I10 Essential (primary) hypertension: Secondary | ICD-10-CM | POA: Diagnosis not present

## 2021-12-29 DIAGNOSIS — Z23 Encounter for immunization: Secondary | ICD-10-CM | POA: Diagnosis not present

## 2022-01-27 DIAGNOSIS — I10 Essential (primary) hypertension: Secondary | ICD-10-CM | POA: Diagnosis not present

## 2022-03-25 DIAGNOSIS — R051 Acute cough: Secondary | ICD-10-CM | POA: Diagnosis not present

## 2022-03-25 DIAGNOSIS — J029 Acute pharyngitis, unspecified: Secondary | ICD-10-CM | POA: Diagnosis not present

## 2022-03-25 DIAGNOSIS — R197 Diarrhea, unspecified: Secondary | ICD-10-CM | POA: Diagnosis not present

## 2022-03-25 DIAGNOSIS — I9589 Other hypotension: Secondary | ICD-10-CM | POA: Diagnosis not present

## 2022-03-29 DIAGNOSIS — H524 Presbyopia: Secondary | ICD-10-CM | POA: Diagnosis not present

## 2022-03-29 DIAGNOSIS — H35372 Puckering of macula, left eye: Secondary | ICD-10-CM | POA: Diagnosis not present

## 2022-03-29 DIAGNOSIS — H52203 Unspecified astigmatism, bilateral: Secondary | ICD-10-CM | POA: Diagnosis not present

## 2022-03-29 DIAGNOSIS — Z961 Presence of intraocular lens: Secondary | ICD-10-CM | POA: Diagnosis not present

## 2022-04-06 DIAGNOSIS — L814 Other melanin hyperpigmentation: Secondary | ICD-10-CM | POA: Diagnosis not present

## 2022-04-06 DIAGNOSIS — D1801 Hemangioma of skin and subcutaneous tissue: Secondary | ICD-10-CM | POA: Diagnosis not present

## 2022-04-06 DIAGNOSIS — L821 Other seborrheic keratosis: Secondary | ICD-10-CM | POA: Diagnosis not present

## 2022-04-06 DIAGNOSIS — D224 Melanocytic nevi of scalp and neck: Secondary | ICD-10-CM | POA: Diagnosis not present

## 2022-04-06 DIAGNOSIS — Z85828 Personal history of other malignant neoplasm of skin: Secondary | ICD-10-CM | POA: Diagnosis not present

## 2022-04-06 DIAGNOSIS — L570 Actinic keratosis: Secondary | ICD-10-CM | POA: Diagnosis not present

## 2022-04-06 DIAGNOSIS — C44619 Basal cell carcinoma of skin of left upper limb, including shoulder: Secondary | ICD-10-CM | POA: Diagnosis not present

## 2022-04-06 DIAGNOSIS — B078 Other viral warts: Secondary | ICD-10-CM | POA: Diagnosis not present

## 2022-04-06 DIAGNOSIS — D485 Neoplasm of uncertain behavior of skin: Secondary | ICD-10-CM | POA: Diagnosis not present

## 2022-04-06 DIAGNOSIS — L400 Psoriasis vulgaris: Secondary | ICD-10-CM | POA: Diagnosis not present

## 2022-04-27 DIAGNOSIS — C44619 Basal cell carcinoma of skin of left upper limb, including shoulder: Secondary | ICD-10-CM | POA: Diagnosis not present

## 2022-06-23 DIAGNOSIS — Z23 Encounter for immunization: Secondary | ICD-10-CM | POA: Diagnosis not present

## 2022-07-18 DIAGNOSIS — F325 Major depressive disorder, single episode, in full remission: Secondary | ICD-10-CM | POA: Diagnosis not present

## 2022-07-18 DIAGNOSIS — Z1331 Encounter for screening for depression: Secondary | ICD-10-CM | POA: Diagnosis not present

## 2022-07-18 DIAGNOSIS — Z0000 Encounter for general adult medical examination without abnormal findings: Secondary | ICD-10-CM | POA: Diagnosis not present

## 2022-07-18 DIAGNOSIS — E039 Hypothyroidism, unspecified: Secondary | ICD-10-CM | POA: Diagnosis not present

## 2022-07-18 DIAGNOSIS — D126 Benign neoplasm of colon, unspecified: Secondary | ICD-10-CM | POA: Diagnosis not present

## 2022-07-18 DIAGNOSIS — E7800 Pure hypercholesterolemia, unspecified: Secondary | ICD-10-CM | POA: Diagnosis not present

## 2022-07-18 DIAGNOSIS — J309 Allergic rhinitis, unspecified: Secondary | ICD-10-CM | POA: Diagnosis not present

## 2022-07-18 DIAGNOSIS — R519 Headache, unspecified: Secondary | ICD-10-CM | POA: Diagnosis not present

## 2022-07-18 DIAGNOSIS — R194 Change in bowel habit: Secondary | ICD-10-CM | POA: Diagnosis not present

## 2022-07-18 DIAGNOSIS — I10 Essential (primary) hypertension: Secondary | ICD-10-CM | POA: Diagnosis not present

## 2022-07-18 DIAGNOSIS — Z79890 Hormone replacement therapy (postmenopausal): Secondary | ICD-10-CM | POA: Diagnosis not present

## 2022-07-20 ENCOUNTER — Other Ambulatory Visit (HOSPITAL_COMMUNITY): Payer: Self-pay | Admitting: Physician Assistant

## 2022-07-20 ENCOUNTER — Other Ambulatory Visit: Payer: Self-pay | Admitting: Physician Assistant

## 2022-07-20 DIAGNOSIS — Z1231 Encounter for screening mammogram for malignant neoplasm of breast: Secondary | ICD-10-CM

## 2022-07-20 DIAGNOSIS — E7800 Pure hypercholesterolemia, unspecified: Secondary | ICD-10-CM

## 2022-08-15 ENCOUNTER — Ambulatory Visit (HOSPITAL_COMMUNITY)
Admission: RE | Admit: 2022-08-15 | Discharge: 2022-08-15 | Disposition: A | Discharge status: Home / Self Care | Payer: Medicare Other | Source: Ambulatory Visit | Attending: Physician Assistant | Admitting: Physician Assistant

## 2022-08-15 DIAGNOSIS — E7800 Pure hypercholesterolemia, unspecified: Secondary | ICD-10-CM | POA: Insufficient documentation

## 2022-08-18 ENCOUNTER — Ambulatory Visit
Admission: RE | Admit: 2022-08-18 | Discharge: 2022-08-18 | Disposition: A | Discharge status: Home / Self Care | Payer: Medicare Other | Source: Ambulatory Visit | Attending: Physician Assistant | Admitting: Physician Assistant

## 2022-08-18 DIAGNOSIS — Z1231 Encounter for screening mammogram for malignant neoplasm of breast: Secondary | ICD-10-CM | POA: Diagnosis not present

## 2022-09-21 ENCOUNTER — Inpatient Hospital Stay (HOSPITAL_COMMUNITY)
Admission: EM | Admit: 2022-09-21 | Discharge: 2022-09-23 | DRG: 061 | Disposition: A | Discharge status: Home / Self Care | Payer: Medicare Other | Attending: Internal Medicine | Admitting: Internal Medicine

## 2022-09-21 ENCOUNTER — Other Ambulatory Visit (HOSPITAL_COMMUNITY): Payer: Medicare Other

## 2022-09-21 ENCOUNTER — Emergency Department (HOSPITAL_COMMUNITY): Payer: Medicare Other

## 2022-09-21 ENCOUNTER — Inpatient Hospital Stay (HOSPITAL_COMMUNITY): Payer: Medicare Other

## 2022-09-21 DIAGNOSIS — I639 Cerebral infarction, unspecified: Secondary | ICD-10-CM | POA: Diagnosis present

## 2022-09-21 DIAGNOSIS — R29705 NIHSS score 5: Secondary | ICD-10-CM | POA: Diagnosis not present

## 2022-09-21 DIAGNOSIS — Y92239 Unspecified place in hospital as the place of occurrence of the external cause: Secondary | ICD-10-CM | POA: Diagnosis not present

## 2022-09-21 DIAGNOSIS — T45615A Adverse effect of thrombolytic drugs, initial encounter: Secondary | ICD-10-CM | POA: Diagnosis not present

## 2022-09-21 DIAGNOSIS — I6381 Other cerebral infarction due to occlusion or stenosis of small artery: Principal | ICD-10-CM | POA: Diagnosis present

## 2022-09-21 DIAGNOSIS — G8191 Hemiplegia, unspecified affecting right dominant side: Secondary | ICD-10-CM | POA: Diagnosis not present

## 2022-09-21 DIAGNOSIS — Z79890 Hormone replacement therapy (postmenopausal): Secondary | ICD-10-CM | POA: Diagnosis not present

## 2022-09-21 DIAGNOSIS — S06320D Contusion and laceration of left cerebrum without loss of consciousness, subsequent encounter: Secondary | ICD-10-CM | POA: Diagnosis not present

## 2022-09-21 DIAGNOSIS — R739 Hyperglycemia, unspecified: Secondary | ICD-10-CM | POA: Diagnosis not present

## 2022-09-21 DIAGNOSIS — R42 Dizziness and giddiness: Secondary | ICD-10-CM | POA: Diagnosis not present

## 2022-09-21 DIAGNOSIS — Z7982 Long term (current) use of aspirin: Secondary | ICD-10-CM | POA: Diagnosis not present

## 2022-09-21 DIAGNOSIS — I6200 Nontraumatic subdural hemorrhage, unspecified: Secondary | ICD-10-CM | POA: Diagnosis not present

## 2022-09-21 DIAGNOSIS — R202 Paresthesia of skin: Secondary | ICD-10-CM | POA: Diagnosis not present

## 2022-09-21 DIAGNOSIS — S06320A Contusion and laceration of left cerebrum without loss of consciousness, initial encounter: Secondary | ICD-10-CM | POA: Diagnosis not present

## 2022-09-21 DIAGNOSIS — I611 Nontraumatic intracerebral hemorrhage in hemisphere, cortical: Secondary | ICD-10-CM | POA: Diagnosis not present

## 2022-09-21 DIAGNOSIS — I6389 Other cerebral infarction: Secondary | ICD-10-CM | POA: Diagnosis not present

## 2022-09-21 DIAGNOSIS — R519 Headache, unspecified: Secondary | ICD-10-CM | POA: Diagnosis not present

## 2022-09-21 DIAGNOSIS — Z79899 Other long term (current) drug therapy: Secondary | ICD-10-CM | POA: Diagnosis not present

## 2022-09-21 DIAGNOSIS — E785 Hyperlipidemia, unspecified: Secondary | ICD-10-CM | POA: Diagnosis present

## 2022-09-21 DIAGNOSIS — H547 Unspecified visual loss: Secondary | ICD-10-CM | POA: Diagnosis not present

## 2022-09-21 DIAGNOSIS — I10 Essential (primary) hypertension: Secondary | ICD-10-CM | POA: Diagnosis present

## 2022-09-21 DIAGNOSIS — R200 Anesthesia of skin: Secondary | ICD-10-CM | POA: Diagnosis not present

## 2022-09-21 DIAGNOSIS — I609 Nontraumatic subarachnoid hemorrhage, unspecified: Secondary | ICD-10-CM | POA: Diagnosis not present

## 2022-09-21 DIAGNOSIS — E039 Hypothyroidism, unspecified: Secondary | ICD-10-CM | POA: Diagnosis present

## 2022-09-21 DIAGNOSIS — Z803 Family history of malignant neoplasm of breast: Secondary | ICD-10-CM

## 2022-09-21 DIAGNOSIS — R29818 Other symptoms and signs involving the nervous system: Secondary | ICD-10-CM | POA: Diagnosis not present

## 2022-09-21 LAB — COMPREHENSIVE METABOLIC PANEL
ALT: 27 U/L (ref 0–44)
AST: 26 U/L (ref 15–41)
Albumin: 3.5 g/dL (ref 3.5–5.0)
Alkaline Phosphatase: 52 U/L (ref 38–126)
Anion gap: 11 (ref 5–15)
BUN: 20 mg/dL (ref 8–23)
CO2: 19 mmol/L — ABNORMAL LOW (ref 22–32)
Calcium: 8.5 mg/dL — ABNORMAL LOW (ref 8.9–10.3)
Chloride: 107 mmol/L (ref 98–111)
Creatinine, Ser: 0.94 mg/dL (ref 0.44–1.00)
GFR, Estimated: 60 mL/min — ABNORMAL LOW (ref >60)
Glucose, Bld: 111 mg/dL — ABNORMAL HIGH (ref 70–99)
Potassium: 3.8 mmol/L (ref 3.5–5.1)
Sodium: 137 mmol/L (ref 135–145)
Total Bilirubin: 0.7 mg/dL (ref 0.3–1.2)
Total Protein: 6.3 g/dL — ABNORMAL LOW (ref 6.5–8.1)

## 2022-09-21 LAB — CBC
HCT: 36.6 % (ref 36.0–46.0)
Hemoglobin: 12.3 g/dL (ref 12.0–15.0)
MCH: 33.2 pg (ref 26.0–34.0)
MCHC: 33.6 g/dL (ref 30.0–36.0)
MCV: 98.7 fL (ref 80.0–100.0)
Platelets: 202 10*3/uL (ref 150–400)
RBC: 3.71 MIL/uL — ABNORMAL LOW (ref 3.87–5.11)
RDW: 13.5 % (ref 11.5–15.5)
WBC: 5.8 10*3/uL (ref 4.0–10.5)
nRBC: 0 % (ref 0.0–0.2)

## 2022-09-21 LAB — DIFFERENTIAL
Abs Immature Granulocytes: 0.02 10*3/uL (ref 0.00–0.07)
Basophils Absolute: 0.1 10*3/uL (ref 0.0–0.1)
Basophils Relative: 1 %
Eosinophils Absolute: 0.1 10*3/uL (ref 0.0–0.5)
Eosinophils Relative: 1 %
Immature Granulocytes: 0 %
Lymphocytes Relative: 25 %
Lymphs Abs: 1.5 10*3/uL (ref 0.7–4.0)
Monocytes Absolute: 0.5 10*3/uL (ref 0.1–1.0)
Monocytes Relative: 8 %
Neutro Abs: 3.7 10*3/uL (ref 1.7–7.7)
Neutrophils Relative %: 65 %

## 2022-09-21 LAB — TYPE AND SCREEN
ABO/RH(D): A POS
Antibody Screen: NEGATIVE

## 2022-09-21 LAB — PREPARE CRYOPRECIPITATE: Unit division: 0

## 2022-09-21 LAB — ABO/RH: ABO/RH(D): A POS

## 2022-09-21 LAB — BPAM CRYOPRECIPITATE
Blood Product Expiration Date: 202407292359
Unit Type and Rh: 5100
Unit Type and Rh: 5100

## 2022-09-21 LAB — PROTIME-INR
INR: 1 (ref 0.8–1.2)
Prothrombin Time: 13.4 seconds (ref 11.4–15.2)

## 2022-09-21 LAB — HEMOGLOBIN A1C
Hgb A1c MFr Bld: 5.2 % (ref 4.8–5.6)
Mean Plasma Glucose: 102.54 mg/dL

## 2022-09-21 LAB — APTT: aPTT: 21 seconds — ABNORMAL LOW (ref 24–36)

## 2022-09-21 LAB — CBG MONITORING, ED: Glucose-Capillary: 101 mg/dL — ABNORMAL HIGH (ref 70–99)

## 2022-09-21 MED ORDER — SENNOSIDES-DOCUSATE SODIUM 8.6-50 MG PO TABS: 1.0000 | ORAL_TABLET | Freq: Every evening | ORAL | Status: DC | PRN

## 2022-09-21 MED ORDER — STROKE: EARLY STAGES OF RECOVERY BOOK: Freq: Once | Status: DC

## 2022-09-21 MED ORDER — ACETAMINOPHEN 325 MG PO TABS: 650.0000 mg | ORAL_TABLET | ORAL | Status: DC | PRN

## 2022-09-21 MED ORDER — SODIUM CHLORIDE 0.9 % IV SOLN: Freq: Once | INTRAVENOUS | Status: DC

## 2022-09-21 MED ORDER — ACETAMINOPHEN 650 MG RE SUPP: 650.0000 mg | RECTAL | Status: DC | PRN

## 2022-09-21 MED ORDER — SODIUM CHLORIDE 0.9 % IV SOLN: INTRAVENOUS | Status: DC

## 2022-09-21 MED ORDER — TRANEXAMIC ACID-NACL 1000-0.7 MG/100ML-% IV SOLN
1000.0000 mg | INTRAVENOUS | Status: AC
  Administered 2022-09-21: 1000 mg via INTRAVENOUS
  Filled 2022-09-21: qty 100

## 2022-09-21 MED ORDER — ONDANSETRON HCL 4 MG/2ML IJ SOLN
4.0000 mg | Freq: Four times a day (QID) | INTRAMUSCULAR | Status: DC | PRN
  Administered 2022-09-21: 4 mg via INTRAVENOUS

## 2022-09-21 MED ORDER — LABETALOL HCL 5 MG/ML IV SOLN: 20.0000 mg | Freq: Once | INTRAVENOUS | Status: DC

## 2022-09-21 MED ORDER — CLEVIDIPINE BUTYRATE 0.5 MG/ML IV EMUL
0.0000 mg/h | INTRAVENOUS | Status: DC
  Filled 2022-09-21: qty 50

## 2022-09-21 MED ORDER — ONDANSETRON HCL 4 MG/2ML IJ SOLN
INTRAMUSCULAR | Status: AC
  Filled 2022-09-21: qty 2

## 2022-09-21 MED ORDER — PANTOPRAZOLE SODIUM 40 MG IV SOLR
40.0000 mg | Freq: Every day | INTRAVENOUS | Status: DC
  Administered 2022-09-21: 40 mg via INTRAVENOUS
  Filled 2022-09-21: qty 10

## 2022-09-21 MED ORDER — ACETAMINOPHEN 160 MG/5ML PO SOLN: 650.0000 mg | ORAL | Status: DC | PRN

## 2022-09-21 MED ORDER — PANTOPRAZOLE SODIUM 40 MG IV SOLR: 40.0000 mg | Freq: Every day | INTRAVENOUS | Status: DC

## 2022-09-21 MED ORDER — SENNOSIDES-DOCUSATE SODIUM 8.6-50 MG PO TABS: 1.0000 | ORAL_TABLET | Freq: Two times a day (BID) | ORAL | Status: DC

## 2022-09-21 MED ORDER — ACETAMINOPHEN 325 MG PO TABS
650.0000 mg | ORAL_TABLET | ORAL | Status: DC | PRN
  Administered 2022-09-22 – 2022-09-23 (×2): 650 mg via ORAL
  Filled 2022-09-21 (×2): qty 2

## 2022-09-21 MED ORDER — TENECTEPLASE FOR STROKE
0.2500 mg/kg | PACK | Freq: Once | INTRAVENOUS | Status: AC
  Administered 2022-09-21: 17 mg via INTRAVENOUS
  Filled 2022-09-21: qty 10

## 2022-09-21 MED ORDER — IOHEXOL 350 MG/ML SOLN
75.0000 mL | Freq: Once | INTRAVENOUS | Status: AC | PRN
  Administered 2022-09-21: 75 mL via INTRAVENOUS

## 2022-09-21 NOTE — ED Provider Notes (Signed)
Veronica Webb EMERGENCY DEPARTMENT AT [facility] Provider Note   CSN: [id number] Arrival date & time: 09/21/22  1639  An emergency department physician performed an initial assessment on this suspected stroke patient at 1638.  History  No chief complaint on file.   Veronica Webb is a 82 y.o. female.  Pt states her stroke symptoms started around 3pm.  Patient has history of hypertension and started having some numbness in her right arm right face and right leg with mild weakness.  The symptoms have gotten better and then worse again and got better again.  Patient was seen by neurology immediately and when code stroke was called and patient was then given TNK  The history is provided by the patient and medical records. No language interpreter was used.  Weakness Severity:  Moderate Onset quality:  Sudden Timing:  Constant Progression:  Waxing and waning Chronicity:  New Context: not alcohol use   Relieved by:  Nothing Worsened by:  Nothing Ineffective treatments:  None tried Associated symptoms: no abdominal pain, no chest pain, no cough, no diarrhea, no frequency, no headaches and no seizures        Home Medications Prior to Admission medications   Medication Sig Start Date End Date Taking? Authorizing Provider  aspirin EC 81 MG tablet Take 81 mg by mouth daily.    [provider]  atorvastatin (LIPITOR) 10 MG tablet Take 10 mg by mouth every evening.    [provider]  Cholecalciferol (VITAMIN D) 2000 UNITS tablet Take 4,000 Units by mouth daily.    [provider]  estradiol (ESTRACE) 0.5 MG tablet Take 0.5 mg by mouth daily.    [provider]  influenza vaccine adjuvanted (FLUAD QUADRIVALENT) 0.5 ML injection Inject into the muscle. 11/30/21   Judyann Munson, MD  levothyroxine (SYNTHROID, LEVOTHROID) 25 MCG tablet Take 25 mcg by mouth daily before breakfast.    [provider]      Allergies    Patient has no known  allergies.    Review of Systems   Review of Systems  Constitutional:  Negative for appetite change and fatigue.  HENT:  Negative for congestion, ear discharge and sinus pressure.   Eyes:  Negative for discharge.  Respiratory:  Negative for cough.   Cardiovascular:  Negative for chest pain.  Gastrointestinal:  Negative for abdominal pain and diarrhea.  Genitourinary:  Negative for frequency and hematuria.  Musculoskeletal:  Negative for back pain.  Skin:  Negative for rash.  Neurological:  Positive for weakness. Negative for seizures and headaches.       Numbness and weakness in the right arm right leg and right face  Psychiatric/Behavioral:  Negative for hallucinations.     Physical Exam Updated Vital Signs BP (!) 142/88   Pulse 80   Resp 20   Wt 67.2 kg   SpO2 100%   BMI 24.65 kg/m  Physical Exam Vitals and nursing note reviewed.  Constitutional:      Appearance: She is well-developed.  HENT:     Head: Normocephalic.     Nose: Nose normal.  Eyes:     General: No scleral icterus.    Conjunctiva/sclera: Conjunctivae normal.  Neck:     Thyroid: No thyromegaly.  Cardiovascular:     Rate and Rhythm: Normal rate and regular rhythm.     Heart sounds: No murmur heard.    No friction rub. No gallop.  Pulmonary:     Breath sounds: No stridor. No wheezing  or rales.  Chest:     Chest wall: No tenderness.  Abdominal:     General: There is no distension.     Tenderness: There is no abdominal tenderness. There is no rebound.  Musculoskeletal:        General: Normal range of motion.     Cervical back: Neck supple.  Lymphadenopathy:     Cervical: No cervical adenopathy.  Skin:    Findings: No erythema or rash.  Neurological:     Mental Status: She is alert and oriented to person, place, and time.     Motor: No abnormal muscle tone.     Coordination: Coordination normal.     Comments: Minimal numbness to face right arm right leg  Psychiatric:        Behavior: Behavior  normal.     ED Results / Procedures / Treatments   Labs (all labs ordered are listed, but only abnormal results are displayed) Labs Reviewed  CBG MONITORING, ED - Abnormal; Notable for the following components:      Result Value   Glucose-Capillary 101 (*)    All other components within normal limits  PROTIME-INR  APTT  CBC  DIFFERENTIAL  COMPREHENSIVE METABOLIC PANEL  ETHANOL  HEMOGLOBIN A1C  LIPID PANEL  I-STAT CHEM 8, ED    EKG None  Radiology CT ANGIO HEAD NECK W WO CM (CODE STROKE)  Result Date: 09/21/2022 CLINICAL DATA:  Neuro deficit, acute, stroke suspected EXAM: CT ANGIOGRAPHY HEAD AND NECK WITH AND WITHOUT CONTRAST TECHNIQUE: Multidetector CT imaging of the head and neck was performed using the standard protocol during bolus administration of intravenous contrast. Multiplanar CT image reconstructions and MIPs were obtained to evaluate the vascular anatomy. Carotid stenosis measurements (when applicable) are obtained utilizing NASCET criteria, using the distal internal carotid diameter as the denominator. RADIATION DOSE REDUCTION: This exam was performed according to the departmental dose-optimization program which includes automated exposure control, adjustment of the mA and/or kV according to patient size and/or use of iterative reconstruction technique. CONTRAST:  75mL OMNIPAQUE IOHEXOL 350 MG/ML SOLN COMPARISON:  Same day CT head. FINDINGS: CTA NECK FINDINGS Aortic arch: Great vessel origins are patent. Right carotid system: No evidence of dissection, stenosis (50% or greater), or occlusion. Left carotid system: No evidence of dissection, stenosis (50% or greater), or occlusion. Vertebral arteries: Codominant. No evidence of dissection, stenosis (50% or greater), or occlusion. Skeleton: No acute abnormality on limited assessment. Other neck: Negative. Upper chest: Visualized lung apices are clear. Review of the MIP images confirms the above findings CTA HEAD FINDINGS  Anterior circulation: Bilateral intracranial ICAs, MCAs, and ACAs are patent without proximal hemodynamically significant stenosis. Posterior circulation: Bilateral intradural vertebral arteries, basilar artery and right posterior cerebral artery are patent. Left fetal type PCA. 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 Venous sinuses: As permitted by contrast timing, patent. 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 Findings discussed with Dr. Wilford Corner via telephone at 5:09 PM. Electronically Signed   By: Feliberto Harts M.D.   On: 09/21/2022 17:10   CT HEAD CODE STROKE WO CONTRAST  Result Date: 09/21/2022 CLINICAL DATA:  Code stroke. Neuro deficit, acute, stroke suspected. Right-sided numbness. EXAM: CT HEAD WITHOUT CONTRAST TECHNIQUE: Contiguous axial images were obtained from the base of the skull through the vertex without intravenous contrast. RADIATION DOSE REDUCTION: This exam was performed according to the departmental dose-optimization program which  includes automated exposure control, adjustment of the mA and/or kV according to patient size and/or use of iterative reconstruction technique. COMPARISON:  None Available. FINDINGS: Brain: No acute infarct, hemorrhage, or mass lesion is present. Deep brain nuclei are within normal limits. The insular cortex is normal. No acute or focal cortical abnormality is present. The ventricles are of normal size. No significant extraaxial fluid collection is present. The brainstem and cerebellum are within normal limits. Midline structures are within normal limits. Vascular: No hyperdense vessel or unexpected calcification. Skull: No significant extracranial soft tissue lesion is present. Calvarium is intact. No focal lytic or blastic lesions are present. Sinuses/Orbits: The paranasal sinuses and mastoid air cells are clear. Bilateral lens  replacements are noted. Globes and orbits are otherwise unremarkable. ASPECTS [facility] Stroke Program Early CT Score) - Ganglionic level infarction (caudate, lentiform nuclei, internal capsule, insula, M1-M3 cortex): 7/7 - Supraganglionic infarction (M4-M6 cortex): 3/3 Total score (0-10 with 10 being normal): 10/10 IMPRESSION: 1. Negative CT of the head. 2. Aspects is 10/10. The above was relayed via text pager to Dr. Jomarie Longs Montie Gelardi on 09/21/2022 at 16:52 . Electronically Signed   By: Marin Roberts M.D.   On: 09/21/2022 16:53    Procedures Procedures    Medications Ordered in ED Medications   stroke: early stages of recovery book (has no administration in time range)  0.9 %  sodium chloride infusion (has no administration in time range)  acetaminophen (TYLENOL) tablet 650 mg (has no administration in time range)    Or  acetaminophen (TYLENOL) 160 MG/5ML solution 650 mg (has no administration in time range)    Or  acetaminophen (TYLENOL) suppository 650 mg (has no administration in time range)  senna-docusate (Senokot-S) tablet 1 tablet (has no administration in time range)  pantoprazole (PROTONIX) injection 40 mg (has no administration in time range)  labetalol (NORMODYNE) injection 20 mg (has no administration in time range)    And  clevidipine (CLEVIPREX) infusion 0.5 mg/mL (has no administration in time range)  tenecteplase (TNKASE) injection for Stroke 17 mg (17 mg Intravenous Given 09/21/22 1649)  iohexol (OMNIPAQUE) 350 MG/ML injection 75 mL (75 mLs Intravenous Contrast Given 09/21/22 1700)    ED Course/ Medical Decision Making/ A&P  CRITICAL CARE Performed by: Bethann Berkshire Total critical care time: 35 minutes Critical care time was exclusive of separately billable procedures and treating other patients. Critical care was necessary to treat or prevent imminent or life-threatening deterioration. Critical care was time spent personally by me on the following activities:  development of treatment plan with patient and/or surrogate as well as nursing, discussions with consultants, evaluation of patient's response to treatment, examination of patient, obtaining history from patient or surrogate, ordering and performing treatments and interventions, ordering and review of laboratory studies, ordering and review of radiographic studies, pulse oximetry and re-evaluation of patient's condition.                            Medical Decision Making Amount and/or Complexity of Data Reviewed Labs: ordered. Radiology: ordered.  Risk Decision regarding hospitalization.    This patient presents to the ED for concern of numbness and weakness, this involves an extensive number of treatment options, and is a complaint that carries with it a high risk of complications and morbidity.  The differential diagnosis includes stroke, tumor.   Co morbidities that complicate the patient evaluation  Hypertension   Additional history obtained:  Additional history obtained from patient  External records from outside source obtained and reviewed including hospital records   Lab Tests:  I Ordered, and personally interpreted labs.  The pertinent results include: CBC and chemistry unremarkable   Imaging Studies ordered:  I ordered imaging studies including CT head I independently visualized and interpreted imaging which showed negative I agree with the radiologist interpretation   Cardiac Monitoring: / EKG:  The patient was maintained on a cardiac monitor.  I personally viewed and interpreted the cardiac monitored which showed an underlying rhythm of: Normal sinus rhythm   Consultations Obtained:  I requested consultation with the neurology,  and discussed lab and imaging findings as well as pertinent plan - they recommend: Given TNK and admit   Problem List / ED Course / Critical interventions / Medication management  Hypertension and stroke I ordered medication  including TNK Reevaluation of the patient after these medicines showed that the patient improved I have reviewed the patients home medicines and have made adjustments as needed   Social Determinants of Health:  None   Test / Admission - Considered:  None   Acute stroke needing TNK.  Patient is being admitted to neurology        Final Clinical Impression(s) / ED Diagnoses Final diagnoses:  None    Rx / DC Orders ED Discharge Orders     None         Bethann Berkshire, MD 09/23/22 [phone]

## 2022-09-21 NOTE — H&P (Signed)
Stroke Neurology Admission History & Physical   CC: Right-sided numbness and weakness that is fluctuating  History is obtained from: Patient and chart  HPI: Veronica Webb is a 82 y.o. female past medical history of hypertension, hypothyroidism, on a baby aspirin, presented to the emergency room for evaluation of sudden onset of right-sided numbness and weakness symptoms.  Last known well was around 16 p.m.  She was at a grocery store and while walking out noted that the right side of the body including the face arm and leg feels numb.  Symptoms lasted for about 10 minutes and then started getting better for a few minutes and then worsen again.  This has happened multiple times since 3:15 PM.  She was brought in by EMS.  Code stroke was activated after her arrival in the ER-stroke team to look into the reason for that. I happened to be in the emergency department and was able to see her even before the activation of code stroke.-See detailed exam below. She is not on any blood thinners.  No recent surgeries.  Contraindications for TNKase and risks benefits and alternatives discussed.    LKW: 3:15 PM IV thrombolysis given?:  Yes Premorbid modified Rankin scale (mRS): 0  ROS: Full ROS was performed and is negative except as noted in the HPI.  Past Medical History:  Diagnosis Date   Hypothyroidism      Family History  Problem Relation Age of Onset   Breast cancer Sister      Social History:   reports that she has never smoked. She has never used smokeless tobacco. She reports current alcohol use. She reports that she does not use drugs.  Medications  Current Facility-Administered Medications:    [START ON 09/22/2022]  stroke: early stages of recovery book, , Does not apply, Once, Milon Dikes, MD   [START ON 09/22/2022]  stroke: early stages of recovery book, , Does not apply, Once, Milon Dikes, MD   0.9 %  sodium chloride infusion, , Intravenous, Continuous, Milon Dikes, MD    0.9 %  sodium chloride infusion, , Intravenous, Once, Milon Dikes, MD   acetaminophen (TYLENOL) tablet 650 mg, 650 mg, Oral, Q4H PRN **OR** acetaminophen (TYLENOL) 160 MG/5ML solution 650 mg, 650 mg, Per Tube, Q4H PRN **OR** acetaminophen (TYLENOL) suppository 650 mg, 650 mg, Rectal, Q4H PRN, Milon Dikes, MD   acetaminophen (TYLENOL) tablet 650 mg, 650 mg, Oral, Q4H PRN **OR** acetaminophen (TYLENOL) 160 MG/5ML solution 650 mg, 650 mg, Per Tube, Q4H PRN **OR** acetaminophen (TYLENOL) suppository 650 mg, 650 mg, Rectal, Q4H PRN, Milon Dikes, MD   labetalol (NORMODYNE) injection 20 mg, 20 mg, Intravenous, Once **AND** clevidipine (CLEVIPREX) infusion 0.5 mg/mL, 0-21 mg/hr, Intravenous, Continuous, Milon Dikes, MD   pantoprazole (PROTONIX) injection 40 mg, 40 mg, Intravenous, QHS, Milon Dikes, MD   pantoprazole (PROTONIX) injection 40 mg, 40 mg, Intravenous, QHS, Milon Dikes, MD   senna-docusate (Senokot-S) tablet 1 tablet, 1 tablet, Oral, QHS PRN, Milon Dikes, MD   senna-docusate (Senokot-S) tablet 1 tablet, 1 tablet, Oral, BID, Milon Dikes, MD   tranexamic acid (CYKLOKAPRON) IVPB 1,000 mg, 1,000 mg, Intravenous, STAT, Milon Dikes, MD, Last Rate: 600 mL/hr at 09/21/22 1845, 1,000 mg at 09/21/22 1845   Exam: Current vital signs: BP (!) 170/82   Pulse (!) 207   Resp 15   Wt 67.2 kg   SpO2 (!) 72%   BMI 24.65 kg/m  Vital signs in last 24 hours: Pulse Rate:  [80-207] 207 (07/24 1815)  Resp:  [9-22] 15 (07/24 1830) BP: (122-179)/(78-97) 170/82 (07/24 1830) SpO2:  [66 %-100 %] 72 % (07/24 1815) Weight:  [67.2 kg] 67.2 kg (07/24 1643)   General: Awake alert in no distress HEENT: Normocephalic atraumatic Lungs: Clear Cardiovascular: Regular rhythm Abdomen nontender Neurological exam She is awake alert oriented x 3 No dysarthria No aphasia Cranial nerves II to XII: Pupils equal round react light, extraocular movements intact, visual fields full, face appears symmetric,  facial sensation mildly diminished on the right, tongue and palate midline. Motor examination with no drift in any of the 4 extremities but the right upper and lower extremity felt very subtly weaker compared to the left. Sensation diminished on the right face arm and leg in comparison to the left. Coordination intact Gait testing deferred NIH stroke scale-2 for sensory   Labs I have reviewed labs in epic and the results pertinent to this consultation are:  CBC No results found for: "WBC", "RBC", "HGB", "HCT", "PLT", "MCV", "MCH", "MCHC", "RDW", "LYMPHSABS", "MONOABS", "EOSABS", "BASOSABS"  CMP  No results found for: "NA", "K", "CL", "CO2", "GLUCOSE", "BUN", "CREATININE", "CALCIUM", "PROT", "ALBUMIN", "AST", "ALT", "ALKPHOS", "BILITOT", "GFRNONAA", "GFRAA"  Lipid Panel  No results found for: "CHOL", "TRIG", "HDL", "CHOLHDL", "VLDL", "LDLCALC", "LDLDIRECT"   Imaging I have reviewed the images obtained:  CT-head aspects 10-no bleed.  Assessment:  82 year old with history of hypertension, hypothyroidism presenting with sudden onset of right face arm and leg numbness and weakness with symptoms on examination consistent with a pure sensory lacunar infarct. Risk benefits and alternatives of IV TNKase discussed-patient agreed to proceed. CT head completed and reviewed prior to TNK administration by me personally. Likely a small vessel etiology infarct.  Plan:  Acute Ischemic Stroke  Acuity: Acute Current Suspected Etiology: Small vessel disease  Continue Evaluation:  -Admit to: Neurological ICU -Hold Aspirin until 24 hour post IV thrombolysis (tPA or TNKase) neuroimaging is stable and without evidence of bleeding -Blood pressure control, goal of below 180/105-post TNK -MRI/ECHO/A1C/Lipid panel. -Hyperglycemia management per SSI to maintain glucose 140-180mg /dL. -PT/OT/ST therapies and recommendations when able  CNS -Close neuromonitoring -For dysarthria/dysphagia following  cerebral infarction that may happen, she needs to be evaluated by bedside swallow screen.  Remain n.p.o. until cleared. -PT OT speech therapy   RESP No active issues Continue to monitor clinically  CV Essential hypertension -Blood pressure goal as above. -As needed meds ordered -TTE -Twelve-lead EKG  Hyperlipidemia, unspecified  - Statin for goal LDL < 70  HEME Labs pending -Check CBC.  ENDO No history of diabetes -Check A1c.  Goal less than 7.  GI/GU CMP pending Gentle hydration  Prophylaxis DVT: SCDs only GI: PPI Bowel: Docusate senna  Diet: NPO until cleared by speech/bedside swallow eval.  Code Status: Full Code  THE FOLLOWING WERE PRESENT ON ADMISSION: Acute stroke  Risks, benefits, alternatives of IV thrombolysis were discussed and family/patient agreed to proceed. CT imaging was reviewed personally prior to IV thrombolysis administration with no evidence of bleed. -- Milon Dikes, MD Neurologist [facility] Pager: [phone]   CRITICAL CARE ATTESTATION Performed by: Milon Dikes, MD Total critical care time: 50 minutes Critical care time was exclusive of separately billable procedures and treating other patients and/or supervising APPs/Residents/Students Critical care was necessary to treat or prevent imminent or life-threatening deterioration. This patient is critically ill and at significant risk for neurological worsening and/or death and care requires constant monitoring. Critical care was time spent personally by me on the following activities: development of treatment plan with  patient and/or surrogate as well as nursing, discussions with consultants, evaluation of patient's response to treatment, examination of patient, obtaining history from patient or surrogate, ordering and performing treatments and interventions, ordering and review of laboratory studies, ordering and review of radiographic studies, pulse oximetry, re-evaluation  of patient's condition, participation in multidisciplinary rounds and medical decision making of high complexity in the care of this patient.  Addendum-1735 onwards CT angio completed after TNK administration.  Left P1/P2 occlusion noted. IR not contacted due to low NIHSS. Patient's remained 2 but then after a little bit had some mild drift in the right upper and lower extremity in addition to the sensory deficit.  At worst the NIH stroke scale is 4. I did discuss this case with the neurointerventionalist on-call who recommended that we look at a perfusion study to see if there is any evidence of a large penumbra, in which case, even with the lower NIH stroke scale, it might be a good idea to pursue thrombectomy. She also complained for worsening headache. Was a 2 in the ER, now much worse.  Stat CTH and CT perfusion ordered. Significant delay in IV access after pt brought down for CT scanner. Needed multiple RN and US guided access to finally get a line that can do CTP. CTH done prior to CTP due to complain of headache -shows HT in the left occipital lobe. CTP canceled. Exam shows left HH - NIHSS 5 now.  Updated plan: Will reverse TNK with TXA and Cryo. Check Fibrinogen 30 min post cryo and if <150 - repeat Cryo Labs still pending. SBP 130-150 CTH at 0100 for stability Rest of the plan as above. Will continue to monitor. D/W husband at bedside. Will sign out to night neurologist for f/u  -- Milon Dikes, MD Neurologist [facility] Pager: [phone]  Additional 45 min cc time.

## 2022-09-21 NOTE — Progress Notes (Addendum)
Came to bedside evaluate patient for transient anisocoria.  Fortunately resolved at the time of my evaluation.  Patient noting some abdominal fullness, improving with urination but she is finding it slightly difficult to urinate.  Good bowel sounds, soft nontender abdomen, no periumbilical signs of bleeding  -Ordered Zofran for nausea, EKG on monitor with QTc 460 after Zofran, hold further doses if QTc remains greater than 450 -Continue plan for first dose of cryoprecipitate (slightly delayed due to type and screen) -Fibrinogen level 30 minutes post cryoprecipitate will also need to be collected and I will review this lab to determine whether there is need for further cryo -Plan for repeat head CT discussed with husband at bedside and I will follow this up as well -Postvoid residual -- if elevated will consider Foley rather than in and out catheterization due to recent TNK (though it has been reversed)  Postvoid residual 800 and patient remains uncomfortable and unable to urinate more than small amounts despite multiple attempts at repositioning by nursing  Risk of complications from acute urinary retention that outweighs the risk of bleeding especially since she has already been reversed, therefore Foley catheter (indwelling rather than in and out to reduce risk of bleeding) ordered

## 2022-09-21 NOTE — Progress Notes (Signed)
Neurologist called to have SRN take pt to complete a CTP r/t concern for P1 occlusion. When down in CT it took 20+ min to get an 18G IV for the scan. During this timeframe pt complained of worsening headache. We did a CT head WO to be safe and found a hemorrhagic conversion. CT results below. We skipped the CTP and brought her back upstairs to ICU. MD came to bedside and updated pt and spouse. Orders for TXA and Cryo were placed. Pt with an NIHSS 5 (drift, sensory, hemianopia, ataxia). PERRLA. BP goals now 130-150. Cleviprex gtt started to keep within range. Bedside handoff with nightshift RN.   "IMPRESSION: New sites of parenchymal hemorrhage centered in the left occipital lobe, likely in the region of infarcted tissue given findings on same day CTA head and neck (Heidelberl Class 1c-2). There is also small volume subarachnoid hemorrhage in this region."   Mareli Antunes, [place] Scrape, RN Stroke Response Nurse  [phone]

## 2022-09-21 NOTE — Code Documentation (Addendum)
Stroke Response Nurse Documentation Code Documentation  Veronica Webb is a 82 y.o. female arriving to [facility]  via [facility] on 09/21/22 with past medical hx of hypertension, hypothyroidism . On aspirin 81 mg daily. Code stroke was activated by ED.   Patient from the grocery store where she was LKW at 1500 and now complaining of numbness/tingling in right arm around 1515. Patient was walking out of the grocery store with her husband when the symptoms started. They were fluctuating in the parking lot. Symptoms have come and gone several times. They noticed firemen in the parking lot and asked them about it. Fire department called EMS and she was taken to [facility],   Stroke team at the bedside on patient arrival. Labs drawn and patient cleared for CT by Dr. Lockie Mola. Patient to CT with team. NIHSS 2, see documentation for details and code stroke times. Patient with right limb ataxia and right decreased sensation on exam. The following imaging was completed:  CT Head and CTA. Patient is a candidate for IV Thrombolytic due to meets criteria. Patient is not a candidate for IR due to no LVO per MD.   Care Plan: 4N ICU admit, q15x2h, q30x6, q1x16 hrs, BP<180/105.   Bedside handoff with ED RN Rodney Booze.    Modena Slater  Stroke Response RN [phone]

## 2022-09-21 NOTE — Progress Notes (Signed)
PHARMACIST CODE STROKE RESPONSE  Notified to mix TNK at 1645 by Dr. Wilford Corner TNK preparation completed at 1647 TNK Given at 1649 after re-gaining IV access  TNK dose = 17 mg IV over 5 seconds  Issues/delays encountered (if applicable): N/A  Cathie Hoops 09/21/22 4:50 PM

## 2022-09-22 ENCOUNTER — Inpatient Hospital Stay (HOSPITAL_COMMUNITY): Payer: Medicare Other

## 2022-09-22 DIAGNOSIS — I6389 Other cerebral infarction: Secondary | ICD-10-CM

## 2022-09-22 DIAGNOSIS — I639 Cerebral infarction, unspecified: Secondary | ICD-10-CM | POA: Diagnosis not present

## 2022-09-22 LAB — BASIC METABOLIC PANEL
Anion gap: 14 (ref 5–15)
BUN: 11 mg/dL (ref 8–23)
CO2: 20 mmol/L — ABNORMAL LOW (ref 22–32)
Calcium: 8.2 mg/dL — ABNORMAL LOW (ref 8.9–10.3)
Chloride: 103 mmol/L (ref 98–111)
Creatinine, Ser: 0.84 mg/dL (ref 0.44–1.00)
GFR, Estimated: >60 mL/min (ref >60)
Glucose, Bld: 97 mg/dL (ref 70–99)
Potassium: 3.6 mmol/L (ref 3.5–5.1)
Sodium: 137 mmol/L (ref 135–145)

## 2022-09-22 LAB — ECHOCARDIOGRAM COMPLETE
AR max vel: 2.62 cm2
Ao pk vel: 1.29 m/s
Area-P 1/2: 3.08 cm2
MV M vel: 2.78 m/s
MV Peak grad: 30.8 mmHg
P 1/2 time: 555 msec
S' Lateral: 2.5 cm

## 2022-09-22 LAB — CBC
HCT: 32 % — ABNORMAL LOW (ref 36.0–46.0)
Hemoglobin: 11 g/dL — ABNORMAL LOW (ref 12.0–15.0)
MCH: 34 pg (ref 26.0–34.0)
MCHC: 34.4 g/dL (ref 30.0–36.0)
MCV: 98.8 fL (ref 80.0–100.0)
Platelets: 173 10*3/uL (ref 150–400)
RBC: 3.24 MIL/uL — ABNORMAL LOW (ref 3.87–5.11)
RDW: 13.7 % (ref 11.5–15.5)
WBC: 7 10*3/uL (ref 4.0–10.5)
nRBC: 0 % (ref 0.0–0.2)

## 2022-09-22 LAB — PREPARE CRYOPRECIPITATE: Unit division: 0

## 2022-09-22 LAB — BPAM CRYOPRECIPITATE
Blood Product Expiration Date: 202407272359
ISSUE DATE / TIME: 202407242034
ISSUE DATE / TIME: 202407242036

## 2022-09-22 LAB — LIPID PANEL
Cholesterol: 204 mg/dL — ABNORMAL HIGH (ref 0–200)
HDL: 70 mg/dL (ref >40)
LDL Cholesterol: 119 mg/dL — ABNORMAL HIGH (ref 0–99)
Total CHOL/HDL Ratio: 2.9 RATIO
Triglycerides: 74 mg/dL (ref <150)
VLDL: 15 mg/dL (ref 0–40)

## 2022-09-22 LAB — FIBRINOGEN: Fibrinogen: 296 mg/dL (ref 210–475)

## 2022-09-22 MED ORDER — CHLORHEXIDINE GLUCONATE CLOTH 2 % EX PADS
6.0000 | MEDICATED_PAD | Freq: Every day | CUTANEOUS | Status: DC
  Administered 2022-09-22 – 2022-09-23 (×2): 6 via TOPICAL

## 2022-09-22 NOTE — Progress Notes (Signed)
Fibrinogen to be redrawn 30 min after completion of cryo infusion. RN informed phlebotomy staff of time fibrinogen lab to be redrawn.   About 20 minutes after phlebotomy staff had drawn lab, a pending lab was not showing up in results review. RN called to clarify it had been drawn. Lab staff stated that the lab had been collected but hadn't been sent down.   Around 0020, the lab was still not showing in the results or the "not resulted." RN called lab again and lab staff said the lab had been collected at 2330. Lab staff called phlebotomy and reported to the RN that the phlebotomy staff member was waiting to send all of their collections at once. RN stated that the lab was times and needed to be resulted as soon as possible to determine the need for further intervention.

## 2022-09-22 NOTE — Evaluation (Signed)
Speech Language Pathology Evaluation Patient Details Name: Veronica Webb MRN: [id number] DOB: 12/06/1940 Today's Date: 09/22/2022 Time: 0865-7846 SLP Time Calculation (min) (ACUTE ONLY): 13 min  Problem List:  Patient Active Problem List   Diagnosis Date Noted   Acute ischemic stroke (HCC) 09/21/2022   Past Medical History:  Past Medical History:  Diagnosis Date   Hypothyroidism    Past Surgical History:  Past Surgical History:  Procedure Laterality Date   ABDOMINAL HYSTERECTOMY  1985   ovaries removed   COLONOSCOPY     COLONOSCOPY WITH PROPOFOL N/A 01/29/2013   Procedure: COLONOSCOPY WITH PROPOFOL;  Surgeon: Charolett Bumpers, MD;  Location: [facility];  Service: Endoscopy;  Laterality: N/A;   EYE SURGERY     cataract   HPI:  Pt is 82 yo female who presents on 09/21/22 with acute onset R sided numbness and weakness.  Pt received TNK, had worsening HA and found to have hemorrhagic conversion in L occipital lobe, likely same region as infarcted tissue, as well as small volume SAH. PMH: hypertension, hypothyroidism, R eye macular pinch   Assessment / Plan / Recommendation Clinical Impression  Cognitive-linguistic evaluation complete with patient scoring [facility] for all areas assessed. Reading comprehension additionally [facility] although vision impacted by what sounds like a right sided visual field cut. No SLP f/u indicated at this time.    SLP Assessment  SLP Recommendation/Assessment: Patient does not need any further Speech Lanaguage Pathology Services SLP Visit Diagnosis: Cognitive communication deficit (R41.841)    Recommendations for follow up therapy are one component of a multi-disciplinary discharge planning process, led by the attending physician.  Recommendations may be updated based on patient status, additional functional criteria and insurance authorization.    Follow Up Recommendations  No SLP follow up                SLP Evaluation Cognition  Overall Cognitive  Status: Within Functional Limits for tasks assessed       Comprehension  Auditory Comprehension Overall Auditory Comprehension: Appears within functional limits for tasks assessed Visual Recognition/Discrimination Discrimination: Not tested Reading Comprehension Reading Status: Within funtional limits    Expression Expression Primary Mode of Expression: Verbal Verbal Expression Overall Verbal Expression: Appears within functional limits for tasks assessed Written Expression Dominant Hand: Right   Oral / Motor  Oral Motor/Sensory Function Overall Oral Motor/Sensory Function: Within functional limits Motor Speech Overall Motor Speech: Appears within functional limits for tasks assessed           Ferdinand Lango MA, CCC-SLP  Elon Lomeli Meryl 09/22/2022, 1:11 PM

## 2022-09-22 NOTE — Evaluation (Signed)
Physical Therapy Evaluation Patient Details Name: Veronica Webb MRN: [id number] DOB: 1940/05/17 Today's Date: 09/22/2022  History of Present Illness  Pt is 82 yo female who presents on 09/21/22 with acute onset R sided numbness and weakness.  Pt received TNK, had worsening HA and found to have hemorrhagic conversion in L occipital lobe, likely same region as infarcted tissue, as well as small volume SAH. PMH: hypertension, hypothyroidism, R eye macular pinch  Clinical Impression  Pt admitted with above diagnosis. Pt from home with husband at [place], was completely independent, just got back from a river cruise, does water aerobics regularly. Of note she mentions that her younger sister just had a stroke about a month ago. Sensation to lt touch has improved RLE though she continues to have proprioceptive deficits and mild weakness that affect gait speed and balance. Pt needed min A for ambulation 250'. Pt also with R upper visual field cut that is worse than her baseline. PT will follow acutely and recommend outpt PT at d/c. VSS on eval.  Pt currently with functional limitations due to the deficits listed below (see PT Problem List). Pt will benefit from acute skilled PT to increase their independence and safety with mobility to allow discharge.           Assistance Recommended at Discharge Intermittent Supervision/Assistance  If plan is discharge home, recommend the following:  Can travel by private vehicle  A little help with walking and/or transfers;Assist for transportation;Assistance with cooking/housework        Equipment Recommendations None recommended by PT  Recommendations for Other Services  OT consult    Functional Status Assessment Patient has had a recent decline in their functional status and demonstrates the ability to make significant improvements in function in a reasonable and predictable amount of time.     Precautions / Restrictions Precautions Precautions:  Fall Restrictions Weight Bearing Restrictions: No      Mobility  Bed Mobility Overal bed mobility: Modified Independent             General bed mobility comments: pt comes quickly to EOB without assist, no LOB in sitting. Does note that her head feels "weird" and pt notes R upper visual field cut when looking around room    Transfers Overall transfer level: Needs assistance Equipment used: None Transfers: Sit to/from Stand Sit to Stand: Min guard           General transfer comment: LOB to R with initial stand and pt sat back to bed. Second time pt was able to gain balance, noted decreased wt shift to R    Ambulation/Gait Ambulation/Gait assistance: Min assist Gait Distance (Feet): 250 Feet Assistive device: 1 person hand held assist, None Gait Pattern/deviations: Decreased weight shift to right, Drifts right/left Gait velocity: decreased Gait velocity interpretation: >2.62 ft/sec, indicative of community ambulatory   General Gait Details: began with R HHA and pt reliant on support, progressed with distance but RLE with mild lag behind L. Pt also noting same visual changes and a feeling of her head feeling not right.  Stairs            Wheelchair Mobility     Tilt Bed    Modified Rankin (Stroke Patients Only) Modified Rankin (Stroke Patients Only) Pre-Morbid Rankin Score: No symptoms Modified Rankin: Moderately severe disability     Balance Overall balance assessment: Needs assistance Sitting-balance support: Feet supported, No upper extremity supported Sitting balance-Leahy Scale: Good     Standing balance support:  No upper extremity supported Standing balance-Leahy Scale: Fair Standing balance comment: able to maintain standing at sink in bathroom and use both hands without LOB but limitation evident with R pertubation                             Pertinent Vitals/Pain Pain Assessment Pain Assessment: 0-10 Pain Score: 1  Pain  Location: R sided forehead Pain Descriptors / Indicators: Headache Pain Intervention(s): Limited activity within patient's tolerance, Monitored during session    Home Living Family/patient expects to be discharged to:: Private residence Living Arrangements: Spouse/significant other Available Help at Discharge: Family;Available 24 hours/day Type of Home: House Home Access: Level entry       Home Layout: One level Home Equipment: None Additional Comments: lives with husband in indepenent living (house) at [employer]    Prior Function Prior Level of Function : Independent/Modified Independent;Driving             Mobility Comments: independent, goes to water aerobics at [employer]   Dominant Hand: Right    Extremity/Trunk Assessment   Upper Extremity Assessment Upper Extremity Assessment: Defer to OT evaluation    Lower Extremity Assessment Lower Extremity Assessment: RLE deficits/detail RLE Deficits / Details: sensation to lt touch has improved but she continues to have proprioceptive deficits RLE. Hip flex 4-/5, knee ext 4/5, knee flex 4/5 (compared to 5/5 on LLE for these mvmts) RLE Sensation: decreased proprioception RLE Coordination: decreased gross motor    Cervical / Trunk Assessment Cervical / Trunk Assessment: Normal  Communication   Communication: No difficulties  Cognition Arousal/Alertness: Awake/alert Behavior During Therapy: WFL for tasks assessed/performed Overall Cognitive Status: Within Functional Limits for tasks assessed                                          General Comments General comments (skin integrity, edema, etc.): pt notes that her sister (4 yrs younger) had a stroke recently as well). VSS: BP 127/78, HR in 70's, SPO2 95%    Exercises     Assessment/Plan    PT Assessment Patient needs continued PT services  PT Problem List Decreased strength;Decreased activity tolerance;Decreased balance;Decreased  mobility;Decreased knowledge of precautions;Pain       PT Treatment Interventions Gait training;Stair training;Functional mobility training;Therapeutic activities;Therapeutic exercise;Balance training;Neuromuscular re-education;Cognitive remediation;Patient/family education    PT Goals (Current goals can be found in the Care Plan section)  Acute Rehab PT Goals Patient Stated Goal: return home PT Goal Formulation: With patient Time For Goal Achievement: 10/06/22 Potential to Achieve Goals: Good Additional Goals Additional Goal #1: Pt to score >19 on DGI    Frequency Min 4X/week     Co-evaluation               AM-PAC PT "6 Clicks" Mobility  Outcome Measure Help needed turning from your back to your side while in a flat bed without using bedrails?: None Help needed moving from lying on your back to sitting on the side of a flat bed without using bedrails?: None Help needed moving to and from a bed to a chair (including a wheelchair)?: A Little Help needed standing up from a chair using your arms (e.g., wheelchair or bedside chair)?: A Little Help needed to walk in hospital room?: A Little Help needed climbing 3-5 steps with a  railing? : A Little 6 Click Score: 20    End of Session Equipment Utilized During Treatment: Gait belt Activity Tolerance: Patient tolerated treatment well Patient left: in chair;with call bell/phone within reach;with chair alarm set Nurse Communication: Mobility status PT Visit Diagnosis: Unsteadiness on feet (R26.81);Ataxic gait (R26.0);Hemiplegia and hemiparesis;Pain Hemiplegia - Right/Left: Right Hemiplegia - dominant/non-dominant: Dominant Hemiplegia - caused by: Cerebral infarction;Nontraumatic SAH Pain - Right/Left: Right Pain - part of body:  (head)    Time: 7829-5621 PT Time Calculation (min) (ACUTE ONLY): 43 min   Charges:   PT Evaluation $PT Eval Moderate Complexity: 1 Mod PT Treatments $Gait Training: 8-22 mins $Therapeutic  Activity: 8-22 mins PT General Charges $$ ACUTE PT VISIT: 1 Visit         Lyanne Co, PT  Acute Rehab Services Secure chat preferred Office [phone]   Lawana Chambers Conor Filsaime 09/22/2022, 11:43 AM

## 2022-09-22 NOTE — Progress Notes (Addendum)
STROKE TEAM PROGRESS NOTE   BRIEF HPI [name] is a 82 y.o. female with history of hypertension and hypothyroidism presenting with acute onset right-sided numbness and weakness.  She was given TNK in the emergency department for presumed stroke.  She was found to have left P1 P2 occlusion on CT angiogram, but no thrombectomy was performed due to low NIHSS.  Patient then began to complain of worsening headache, and CT head revealed hemorrhagic transformation in left occipital lobe.  TNK was reversed, and CT head in the morning of 7/25 was stable.   SIGNIFICANT HOSPITAL EVENTS 7/24 TNK given 7/24 hemorrhagic transformation noted and TNK reversed  INTERIM HISTORY/SUBJECTIVE Seen in her room with her husband at the bedside.  Her husband is wondering if she can go home today.  She has remained hemodynamically stable overnight, and her neurological exam is stable.  Her head CT this morning showed a stable small ICH in the left occipital lobe.   OBJECTIVE  CBC    Component Value Date/Time   WBC 7.0 09/22/2022 0900   RBC 3.24 (L) 09/22/2022 0900   HGB 11.0 (L) 09/22/2022 0900   HCT 32.0 (L) 09/22/2022 0900   PLT 173 09/22/2022 0900   MCV 98.8 09/22/2022 0900   MCH 34.0 09/22/2022 0900   MCHC 34.4 09/22/2022 0900   RDW 13.7 09/22/2022 0900   LYMPHSABS 1.5 09/21/2022 2005   MONOABS 0.5 09/21/2022 2005   EOSABS 0.1 09/21/2022 2005   BASOSABS 0.1 09/21/2022 2005    BMET    Component Value Date/Time   NA 137 09/22/2022 0900   K 3.6 09/22/2022 0900   CL 103 09/22/2022 0900   CO2 20 (L) 09/22/2022 0900   GLUCOSE 97 09/22/2022 0900   BUN 11 09/22/2022 0900   CREATININE 0.84 09/22/2022 0900   CALCIUM 8.2 (L) 09/22/2022 0900   GFRNONAA >60 09/22/2022 0900    IMAGING past 24 hours CT HEAD WO CONTRAST ( )  Result Date: 09/22/2022 CLINICAL DATA:  Follow-up examination for hemorrhagic stroke. EXAM: CT HEAD WITHOUT CONTRAST TECHNIQUE: Contiguous axial images were obtained from  the base of the skull through the vertex without intravenous contrast. RADIATION DOSE REDUCTION: This exam was performed according to the departmental dose-optimization program which includes automated exposure control, adjustment of the mA and/or kV according to patient size and/or use of iterative reconstruction technique. COMPARISON:  Comparison made with prior CT from 09/21/2022. FINDINGS: Brain: Previously identified intraparenchymal hemorrhage positioned at the left occipital lobe again seen. Hemorrhages overall relatively similar in size and morphology measuring approximately 3.9 x 1.5 x 2.5 cm in greatest dimensions (estimated volume 7 mL). No significant surrounding edema or regional mass effect. Trace adjacent subarachnoid hemorrhage is slightly less conspicuous. However, no evident is subdural extension, with small volume blood seen along the left tentorium and likely posterior falx. Subdural component measures no more than 2-3 mm in maximal thickness without significant mass effect. No visible intraventricular extension. No other new intracranial hemorrhage. No other acute large vessel territory infarct. No mass lesion or midline shift. No hydrocephalus. Vascular: No abnormal hyperdense vessel. Scattered vascular calcifications noted within the carotid siphons. Skull: Scalp soft tissues and calvarium demonstrate no new finding. Sinuses/Orbits: Globes and orbital soft tissues within normal limits. Paranasal sinuses are largely clear. No mastoid effusion. Other: None. IMPRESSION: 1. No significant interval change in size and morphology of left occipital intraparenchymal hemorrhage, estimated volume 7 mL. No significant surrounding edema or regional mass effect. 2. Slight interval decrease in  conspicuity of associated trace regional subarachnoid hemorrhage. However, now evident is subdural extension, with small volume blood seen along the left tentorium and likely posterior falx. Subdural component measures  no more than 2-3 mm in maximal thickness without significant mass effect. 3. No other new acute intracranial abnormality. Electronically Signed   By: Rise Mu M.D.   On: 09/22/2022 02:11   CT HEAD WO CONTRAST ( )  Result Date: 09/21/2022 CLINICAL DATA:  Headache, neuro deficit EXAM: CT HEAD WITHOUT CONTRAST TECHNIQUE: Contiguous axial images were obtained from the base of the skull through the vertex without intravenous contrast. RADIATION DOSE REDUCTION: This exam was performed according to the departmental dose-optimization program which includes automated exposure control, adjustment of the mA and/or kV according to patient size and/or use of iterative reconstruction technique. COMPARISON:  Same day CT head and CTA head/neck FINDINGS: Brain: New sites of parenchymal hemorrhage centered in the left occipital lobe, likely in the region of infarcted tissue given findings on same day CTA head and neck (Heidelberl Class 1c-2). There is small volume subarachnoid hemorrhage in this region (series 3, image 21). No hydrocephalus. No evidence of intraventricular blood products. Vascular: No hyperdense vessel or unexpected calcification. Skull: Normal. Negative for fracture or focal lesion. Sinuses/Orbits: No middle ear or mastoid effusion. Paranasal sinuses are clear. Bilateral lens replacement. Orbits are otherwise unremarkable. Other: None. IMPRESSION: New sites of parenchymal hemorrhage centered in the left occipital lobe, likely in the region of infarcted tissue given findings on same day CTA head and neck (Heidelberl Class 1c-2). There is also small volume subarachnoid hemorrhage in this region. These results will be called to the ordering clinician or representative by the Radiologist Assistant, and communication documented in the PACS or [employer]. Electronically Signed   By: Lorenza Cambridge M.D.   On: 09/21/2022 18:33   CT ANGIO HEAD NECK W WO CM (CODE STROKE)  Result Date:  09/21/2022 CLINICAL DATA:  Neuro deficit, acute, stroke suspected EXAM: CT ANGIOGRAPHY HEAD AND NECK WITH AND WITHOUT CONTRAST TECHNIQUE: Multidetector CT imaging of the head and neck was performed using the standard protocol during bolus administration of intravenous contrast. Multiplanar CT image reconstructions and MIPs were obtained to evaluate the vascular anatomy. Carotid stenosis measurements (when applicable) are obtained utilizing NASCET criteria, using the distal internal carotid diameter as the denominator. RADIATION DOSE REDUCTION: This exam was performed according to the departmental dose-optimization program which includes automated exposure control, adjustment of the mA and/or kV according to patient size and/or use of iterative reconstruction technique. CONTRAST:  75mL OMNIPAQUE IOHEXOL 350 MG/ML SOLN COMPARISON:  Same day CT head. FINDINGS: CTA NECK FINDINGS Aortic arch: Great vessel origins are patent. Right carotid system: No evidence of dissection, stenosis (50% or greater), or occlusion. Left carotid system: No evidence of dissection, stenosis (50% or greater), or occlusion. Vertebral arteries: Codominant. No evidence of dissection, stenosis (50% or greater), or occlusion. Skeleton: No acute abnormality on limited assessment. Other neck: Negative. Upper chest: Visualized lung apices are clear. Review of the MIP images confirms the above findings CTA HEAD FINDINGS Anterior circulation: Bilateral intracranial ICAs, MCAs, and ACAs are patent without proximal hemodynamically significant stenosis. Posterior circulation: Bilateral intradural vertebral arteries, basilar artery and right posterior cerebral artery are patent. Left fetal type PCA. 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 Venous sinuses: As permitted by contrast timing, patent. Review of the MIP images confirms the above findings IMPRESSION: Critical, nearly occlusive stenosis of the left P2 PCA with distal  opacification. Findings discussed with Dr. Wilford Corner via telephone at 5:09 PM. Electronically Signed   By: Feliberto Harts M.D.   On: 09/21/2022 17:10   CT HEAD CODE STROKE WO CONTRAST  Result Date: 09/21/2022 CLINICAL DATA:  Code stroke. Neuro deficit, acute, stroke suspected. Right-sided numbness. EXAM: CT HEAD WITHOUT CONTRAST TECHNIQUE: Contiguous axial images were obtained from the base of the skull through the vertex without intravenous contrast. RADIATION DOSE REDUCTION: This exam was performed according to the departmental dose-optimization program which includes automated exposure control, adjustment of the mA and/or kV according to patient size and/or use of iterative reconstruction technique. COMPARISON:  None Available. FINDINGS: Brain: No acute infarct, hemorrhage, or mass lesion is present. Deep brain nuclei are within normal limits. The insular cortex is normal. No acute or focal cortical abnormality is present. The ventricles are of normal size. No significant extraaxial fluid collection is present. The brainstem and cerebellum are within normal limits. Midline structures are within normal limits. Vascular: No hyperdense vessel or unexpected calcification. Skull: No significant extracranial soft tissue lesion is present. Calvarium is intact. No focal lytic or blastic lesions are present. Sinuses/Orbits: The paranasal sinuses and mastoid air cells are clear. Bilateral lens replacements are noted. Globes and orbits are otherwise unremarkable. ASPECTS [facility] Stroke Program Early CT Score) - Ganglionic level infarction (caudate, lentiform nuclei, internal capsule, insula, M1-M3 cortex): 7/7 - Supraganglionic infarction (M4-M6 cortex): 3/3 Total score (0-10 with 10 being normal): 10/10 IMPRESSION: 1. Negative CT of the head. 2. Aspects is 10/10. The above was relayed via text pager to Dr. Jomarie Longs ZAMMIT on 09/21/2022 at 16:52 . Electronically Signed   By: Marin Roberts M.D.   On: 09/21/2022 16:53     Vitals:   09/22/22 0900 09/22/22 1000 09/22/22 1100 09/22/22 1140  BP: (!) 125/59 (!) 128/56 (!) 135/118   Pulse: 66 62 80   Resp: 14 13 18    Temp:    97.7 F (36.5 C)  TempSrc:    Oral  SpO2: 95% 97% 95%   Weight:         PHYSICAL EXAM General:  Alert, well-nourished, well-developed patient in no acute distress Psych:  Mood and affect appropriate for situation CV: Regular rate and rhythm on monitor Respiratory:  Regular, unlabored respirations on room air GI: Abdomen soft and nontender   NEURO:  Mental Status: AA&Ox3, patient is able to give clear and coherent history Speech/Language: speech is without dysarthria or aphasia.  Naming intact.  Cranial Nerves:  II: PERRL.  Decreased vision in small portion of lateral right visual field III, IV, VI: EOMI. Eyelids elevate symmetrically.  VII: Face is symmetrical resting and smiling VIII: hearing intact to voice. IX, X:Phonation is normal.  XII: tongue is midline without fasciculations. Motor: 5/5 strength to all muscle groups tested.  Diminished fine finger movements noted on the right hand.   Tone: is normal and bulk is normal Sensation- Intact to light touch bilaterally with tingling noted in the right arm. Extinction absent to light touch to DSS.   Coordination: FTN intact bilaterally.No drift.  Gait- deferred  NIHSS 1 for vision.  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 ASPECTS 10.    CTA head & neck critical, nearly occlusive stenosis of left P2 PCA Repeat CT head 7/24: New site of parenchymal hemorrhage centered in left occipital lobe, likely in region of  infarcted tissue Repeat CT head 7/25: No significant interval change in size and morphology of left occipital IPH, estimated volume 7 mL, trace regional subarachnoid hemorrhage and subdural  extension MRI pending 2D Echo pending LDL 119 HgbA1c 5.2 VTE prophylaxis -SCDs aspirin 81 mg daily prior to admission, now on No antithrombotic as she is status post TNK reversal in the last 24 hours Therapy recommendations: Pending Disposition: Pending  ICH post TNK TNK reversed, 2 doses of cryoprecipitate given Head CT on 7/25 was stable  Hypertension Home meds: Amlodipine 2.5 mg daily Stable Blood Pressure Goal: SBP between 130-150 for 24 hours and then less than 160   Hyperlipidemia Home meds: Atorvastatin 10 mg daily LDL 119, goal < 70 Increase atorvastatin to 40 milligrams daily at discharge Continue statin at discharge   Other Stroke Risk Factors None   Other Active Problems None  Hospital day # 1 Cortney E Ernestina Columbia , MSN, AGACNP-BC [facility] See Amion for schedule and pager information 09/22/2022 11:59 AM   ATTENDING ATTESTATION:  82 year old with right-sided numbness and right-sided weakness status post TNK that worsened found to have hemorrhagic transformation that required reversal with 2 pools of cryo and 1 g of tranexamic acid.  Fibrinogen level completed after cryo was 296 and thus did not require further reversal.  CT this morning shows that hemorrhage is stable.  Clinically she is doing much better and the only residual symptoms appear to be a small right lateral visual defect and decreased fine finger movements on the right upper extremity.  MRI later today.  If stable would likely move out of the ICU.  PT OT therapy.  Advance diet.  Continue to hold antiplatelet medication due to hemorrhage.  Dr. Viviann Spare evaluated pt independently, reviewed imaging, chart, labs. Discussed and formulated plan with the Resident/APP. Changes were made to the note where appropriate. Please see APP/resident note above for details.      This patient is critically ill due to , stroke s/p tPA and reversal and at significant risk of neurological worsening, death  form heart failure, respiratory failure, recurrent stroke, bleeding from [facility], seizure, sepsis. This patient's care requires constant monitoring of vital signs, hemodynamics, respiratory and cardiac monitoring, review of multiple databases, neurological assessment, discussion with family, other specialists and medical decision making of high complexity. I spent 35 minutes of neurocritical care time in the care of this patient.   Dontaye Hur,MD   To contact Stroke Continuity provider, please refer to [email or web address]. After hours, contact General Neurology

## 2022-09-22 NOTE — Progress Notes (Signed)
PT Cancellation Note  Patient Details Name: KALESE ENSZ MRN: [id number] DOB: 04-23-1940   Cancelled Treatment:    Reason Eval/Treat Not Completed: Active bedrest order. Will check back at a later time.   Lyanne Co, PT  Acute Rehab Services Secure chat preferred Office [phone]    Lawana Chambers Dezzie Badilla 09/22/2022, 8:01 AM

## 2022-09-22 NOTE — Progress Notes (Signed)
Echocardiogram 2D Echocardiogram has been performed.  Veronica Webb 09/22/2022, 9:46 AM

## 2022-09-22 NOTE — Evaluation (Signed)
Occupational Therapy Evaluation Patient Details Name: Veronica Webb MRN: [id number] DOB: Aug 14, 1940 Today's Date: 09/22/2022   History of Present Illness Pt is 82 yo female who presents on 09/21/22 with acute onset R sided numbness and weakness.  Pt received TNK, had worsening HA and found to have hemorrhagic conversion in L occipital lobe, likely same region as infarcted tissue, as well as small volume SAH. PMH: hypertension, hypothyroidism, R eye macular pinch   Clinical Impression   Patient admitted for the diagnosis above.  PTA she lives at home with her spouse, and was independent and very active, continues to drive.  Currently she is moving well, and is needing only generalized supervision, but appears scattered, and having a difficult time organizing her thoughts.  Not sure if outpatient cognitive rehab would be appropriate, or just further testing to determine driving abilities.  With a brief cognitive screen, she had difficulty with counting backwards, and also saying the months of the year in reverse, but did well with money management.  Her vision is also impacted, she describes it as a loss of the upper right quadrant, but when reading a word from 10', she could not see the last letter until she turned her head to the R.  OT will continue efforts in the acute setting to address deficits.         Recommendations for follow up therapy are one component of a multi-disciplinary discharge planning process, led by the attending physician.  Recommendations may be updated based on patient status, additional functional criteria and insurance authorization.   Assistance Recommended at Discharge PRN  Patient can return home with the following Assist for transportation;Direct supervision/assist for financial management    Functional Status Assessment  Patient has had a recent decline in their functional status and demonstrates the ability to make significant improvements in function in a reasonable  and predictable amount of time.  Equipment Recommendations  None recommended by OT    Recommendations for Other Services       Precautions / Restrictions Precautions Precautions: Fall Restrictions Weight Bearing Restrictions: No      Mobility Bed Mobility Overal bed mobility: Modified Independent                  Transfers Overall transfer level: Needs assistance Equipment used: None Transfers: Sit to/from Stand, Bed to chair/wheelchair/BSC Sit to Stand: Supervision     Step pivot transfers: Supervision            Balance Overall balance assessment: Needs assistance Sitting-balance support: Feet supported Sitting balance-Leahy Scale: Good     Standing balance support: No upper extremity supported Standing balance-Leahy Scale: Good                             ADL either performed or assessed with clinical judgement   ADL                                         General ADL Comments: Generalized supervision     Vision Patient Visual Report: Peripheral vision impairment Additional Comments: Upper R Quad vision loss.     Perception     Praxis      Pertinent Vitals/Pain Pain Assessment Pain Assessment: Faces Pain Descriptors / Indicators: Headache Pain Intervention(s): Monitored during session     Hand Dominance Right   Extremity/Trunk Assessment  Upper Extremity Assessment Upper Extremity Assessment: Overall WFL for tasks assessed   Lower Extremity Assessment Lower Extremity Assessment: Defer to PT evaluation RLE Deficits / Details: sensation to lt touch has improved but she continues to have proprioceptive deficits RLE. Hip flex 4-/5, knee ext 4/5, knee flex 4/5 (compared to 5/5 on LLE for these mvmts) RLE Sensation: decreased proprioception RLE Coordination: decreased gross motor   Cervical / Trunk Assessment Cervical / Trunk Assessment: Normal   Communication Communication Communication: No  difficulties   Cognition Arousal/Alertness: Awake/alert Behavior During Therapy: WFL for tasks assessed/performed Overall Cognitive Status: Impaired/Different from baseline Area of Impairment: Memory, Problem solving, Following commands                     Memory: Decreased short-term memory Following Commands: Follows multi-step commands with increased time     Problem Solving: Slow processing       General Comments  VSS: on RA    Exercises     Shoulder Instructions      Home Living Family/patient expects to be discharged to:: Private residence Living Arrangements: Spouse/significant other Available Help at Discharge: Family;Available 24 hours/day Type of Home: House Home Access: Level entry     Home Layout: One level     Bathroom Shower/Tub: Producer, television/film/video: Handicapped height Bathroom Accessibility: Yes How Accessible: Accessible via walker Home Equipment: None   Additional Comments: lives with husband in indepenent living (house) at [employer] With: Spouse    Prior Functioning/Environment Prior Level of Function : Independent/Modified Independent;Driving             Mobility Comments: independent, goes to water aerobics at [facility]          OT Problem List: Impaired vision/perception;Decreased safety awareness;Decreased cognition;Pain      OT Treatment/Interventions: Self-care/ADL training;Therapeutic activities;Cognitive remediation/compensation;Patient/family education;DME and/or AE instruction;Balance training    OT Goals(Current goals can be found in the care plan section) Acute Rehab OT Goals Patient Stated Goal: Return home OT Goal Formulation: With patient Time For Goal Achievement: 10/06/22 Potential to Achieve Goals: Good ADL Goals Pt Will Perform Grooming: Independently;standing Pt Will Perform Lower Body Dressing: Independently;sit to/from stand Pt Will Transfer to Toilet: Independently;regular height  toilet;ambulating  OT Frequency: Min 1X/week    Co-evaluation              AM-PAC OT "6 Clicks" Daily Activity     Outcome Measure Help from another person eating meals?: None Help from another person taking care of personal grooming?: A Little Help from another person toileting, which includes using toliet, bedpan, or urinal?: A Little Help from another person bathing (including washing, rinsing, drying)?: A Little Help from another person to put on and taking off regular upper body clothing?: None Help from another person to put on and taking off regular lower body clothing?: A Little 6 Click Score: 20   End of Session Nurse Communication: Mobility status  Activity Tolerance: Patient tolerated treatment well Patient left: in bed;with call bell/phone within reach;with bed alarm set  OT Visit Diagnosis: Unsteadiness on feet (R26.81);Other symptoms and signs involving cognitive function                Time: 4098-1191 OT Time Calculation (min): 20 min Charges:  OT General Charges $OT Visit: 1 Visit OT Evaluation $OT Eval Moderate Complexity: 1 Mod  09/22/2022  RP, OTR/L  Acute Rehabilitation Services  Office:  [phone]   Suzanna Obey 09/22/2022, 1:50  PM

## 2022-09-23 ENCOUNTER — Inpatient Hospital Stay (HOSPITAL_COMMUNITY): Payer: Medicare Other

## 2022-09-23 DIAGNOSIS — I639 Cerebral infarction, unspecified: Secondary | ICD-10-CM | POA: Diagnosis not present

## 2022-09-23 LAB — CBC: Hemoglobin: 11.1 g/dL — ABNORMAL LOW (ref 12.0–15.0)

## 2022-09-23 MED ORDER — BUTALBITAL-APAP-CAFFEINE 50-325-40 MG PO TABS: 1.0000 | ORAL_TABLET | Freq: Four times a day (QID) | ORAL | Status: DC | PRN

## 2022-09-23 MED ORDER — BUTALBITAL-APAP-CAFFEINE 50-325-40 MG PO TABS: 1.0000 | ORAL_TABLET | Freq: Four times a day (QID) | ORAL | 0 refills | Status: DC | PRN

## 2022-09-23 NOTE — Progress Notes (Signed)
D/C education given to Pt and all questions answered. Printed prescription placed in discharge packet and given to Pt. No equipment to deliver. IVs removed. Pt taken downstairs by volunteers.

## 2022-09-23 NOTE — Discharge Summary (Addendum)
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   atorvastatin 10 MG tablet Commonly known as: LIPITOR Take 10 mg by mouth every evening.   butalbital-acetaminophen-caffeine 50-325-40 MG tablet Commonly known as: FIORICET Take 1 tablet by mouth every 6 (six) hours as needed for headache.   estradiol 0.5 MG tablet Commonly known as: ESTRACE Take 0.5 mg by mouth daily.   levothyroxine 25 MCG tablet Commonly known as: SYNTHROID Take 25 mcg by mouth daily before breakfast.   Vitamin D 50 MCG (2000 UT) tablet Take 4,000 Units by mouth daily.        LABORATORY STUDIES CBC    Component Value Date/Time   WBC 5.5 09/23/2022 0433   RBC 3.33 (L) 09/23/2022 0433   HGB 11.1 (L) 09/23/2022 0433   HCT 34.0 (L) 09/23/2022 0433   PLT 154 09/23/2022 0433   MCV 102.1 (H) 09/23/2022 0433   MCH 33.3 09/23/2022 0433   MCHC 32.6 09/23/2022 0433   RDW 13.7 09/23/2022 0433   LYMPHSABS 1.5 09/21/2022 2005   MONOABS 0.5 09/21/2022 2005   EOSABS 0.1 09/21/2022 2005   BASOSABS 0.1 09/21/2022 2005   CMP    Component Value Date/Time   NA 139 09/23/2022 0433   K 3.7 09/23/2022 0433   CL 105 09/23/2022 0433   CO2 24 09/23/2022 0433   GLUCOSE 93  09/23/2022 0433   BUN 11 09/23/2022 0433   CREATININE 0.93 09/23/2022 0433   CALCIUM 8.3 (L) 09/23/2022 0433   PROT 6.3 (L) 09/21/2022 2005   ALBUMIN 3.5 09/21/2022 2005   AST 26 09/21/2022 2005   ALT 27 09/21/2022 2005   ALKPHOS 52 09/21/2022 2005   BILITOT 0.7 09/21/2022 2005   GFRNONAA >60 09/23/2022 0433   COAGS Lab Results  Component Value Date   INR 1.0 09/21/2022   Lipid Panel    Component Value Date/Time   CHOL 204 (H) 09/21/2022 2325   TRIG 74 09/21/2022 2325   HDL 70 09/21/2022 2325   CHOLHDL 2.9 09/21/2022 2325   VLDL 15 09/21/2022 2325   LDLCALC 119 (H) 09/21/2022 2325   HgbA1C  Lab Results  Component Value Date   HGBA1C 5.2 09/21/2022   No results found for: "ETH"   SIGNIFICANT DIAGNOSTIC STUDIES CT HEAD WO CONTRAST ( )  Result Date: 09/23/2022 CLINICAL DATA:  82 year old female with small scattered left PCA territory infarcts and left occipital lobe hemorrhage. Code stroke presentation status post IV thrombolysis. Subsequent encounter. EXAM: CT HEAD WITHOUT CONTRAST TECHNIQUE: Contiguous axial images were obtained from the base of the skull through the vertex without intravenous contrast. RADIATION DOSE REDUCTION: This exam was performed according to the departmental dose-optimization program  which includes automated exposure control, adjustment of the mA and/or kV according to patient size and/or use of iterative reconstruction technique. COMPARISON:  Head CT and brain MRI yesterday, and earlier. FINDINGS: Brain: Crescent shaped and mildly discontinuous intra-axial hemorrhage in the left occipital lobe appear slightly smaller since yesterday on series 2, image 12. Confluent intra-axial blood volume now 5 mL, versus proximally 9 mL 0113 hours yesterday when measured in a similar way. Mild surrounding edema. Trace extra-axial extension in the subarachnoid space (series 2, image 13). No measurable subdural hematoma by CT. No IVH or ventriculomegaly. No significant  intracranial mass effect. Stable gray-white matter differentiation throughout the brain. The small foci of left PCA territory ischemia or better demonstrated by DWI yesterday. The left thalamic involvement is faintly visible on series 2, image 16. No new areas of infarction are evident. Vascular: Calcified atherosclerosis at the skull base. Skull: Stable, intact. Sinuses/Orbits: Visualized paranasal sinuses and mastoids are stable and well aerated. Other: No acute orbit or scalp soft tissue finding. IMPRESSION: 1. Regressed left occipital lobe hematoma since yesterday. Trace subarachnoid blood. Other left PCA territory ischemia seen on MRI remains subtle by CT. 2. No significant cranial mass effect or new intracranial abnormality. Electronically Signed   By: Odessa Fleming M.D.   On: 09/23/2022 09:23   MR BRAIN WO CONTRAST  Result Date: 09/22/2022 CLINICAL DATA:  Stroke follow-up EXAM: MRI HEAD WITHOUT CONTRAST TECHNIQUE: Multiplanar, multiecho pulse sequences of the brain and surrounding structures were obtained without intravenous contrast. COMPARISON:  Same-day head CT FINDINGS: Brain: The intraparenchymal hematoma in the left occipital lobe measuring approximately 4.2 cm x 1.1 cm is not significantly changed in size, allowing for differences in modality. There is a small amount of overlying extra-axial blood which is likely a combination of subarachnoid and subdural as well as trace subarachnoid hemorrhage overlying the right occipital lobe (11-10). There is mild surrounding edema but no significant mass effect or midline shift. No intraventricular blood is seen. There are punctate foci of cortical diffusion restriction in the left parietal and occipital lobe (5-88, 5-81, 5-78) consistent with small acute infarcts. There is an additional small acute infarct with associated FLAIR signal abnormality in the left thalamocapasular region. Evaluation for infarct underlying the hematoma is limited by artifact from the  blood products. Background parenchymal volume is normal. Patchy FLAIR signal abnormality throughout the remainder of the supratentorial white matter is consistent with underlying mild-to-moderate chronic small-vessel ischemic change. The pituitary and suprasellar region are normal there is no solid mass lesion. Vascular: Normal flow voids. Skull and upper cervical spine: There is no suspicious marrow signal abnormality. There is lobular T1 isointense material in the ventral spinal canal along the posterior aspect of the C2 and C3 vertebral bodies which was not present on the prior CTA suspicious for epidural blood. There is no significant spinal canal stenosis or mass effect on the cord. Sinuses/Orbits: The paranasal sinuses are clear. Bilateral lens implants are in place. The globes and orbits are otherwise unremarkable. Other: The mastoid air cells and middle ear cavities are clear. IMPRESSION: 1. Similar size of the intraparenchymal hematoma in the left occipital lobe compared to the prior CT with small volume overlying extra-axial hemorrhage likely reflecting a combination of subdural and subarachnoid blood. Mild surrounding edema but no mass effect or midline shift. 2. Evaluation for infarct underlying the hemorrhage is limited by artifact from the blood; however, there are small infarcts in the left thalamocapsular region and cerebral cortex in the PCA  distribution. 3. Lobular T1 isointense material in the spinal canal along the posterior aspect of the C2 and C3 vertebral bodies not present on the prior CTA suspicious for epidural blood. No significant spinal canal stenosis or mass effect on the cord. Electronically Signed   By: Lesia Hausen M.D.   On: 09/22/2022 18:15   ECHOCARDIOGRAM COMPLETE  Result Date: 09/22/2022    ECHOCARDIOGRAM REPORT   Patient Name:   MARGORIE OTWELL Date of Exam: 09/22/2022 Medical Rec #:  [id number]    Height:       65.0 in Accession #:    [id number]   Weight:       148.1 lb Date of  Birth:  Nov 26, 1940     BSA:          1.741 m Patient Age:    82 years     BP:           126/56 mmHg Patient Gender: F            HR:           61 bpm. Exam Location:  Inpatient Procedure: 2D Echo, Cardiac Doppler and Color Doppler Indications:   Stroke I63.9  History:       Patient has no prior history of Echocardiogram examinations.                Stroke.  Sonographer:   Lucendia Herrlich Referring      [id number] ASHISH ARORA Phys: IMPRESSIONS  1. Left ventricular ejection fraction, by estimation, is 55 to 60%. The left ventricle has normal function. The left ventricle has no regional wall motion abnormalities. Left ventricular diastolic parameters are consistent with Grade I diastolic dysfunction (impaired relaxation).  2. Right ventricular systolic function is normal. The right ventricular size is normal. There is normal pulmonary artery systolic pressure. The estimated right ventricular systolic pressure is 22.5 mmHg.  3. The mitral valve is normal in structure. Trivial mitral valve regurgitation. No evidence of mitral stenosis.  4. The aortic valve is tricuspid. There is mild calcification of the aortic valve. Aortic valve regurgitation is mild. No aortic stenosis is present.  5. The inferior vena cava is normal in size with greater than 50% respiratory variability, suggesting right atrial pressure of 3 mmHg. FINDINGS  Left Ventricle: Left ventricular ejection fraction, by estimation, is 55 to 60%. The left ventricle has normal function. The left ventricle has no regional wall motion abnormalities. The left ventricular internal cavity size was normal in size. There is  no left ventricular hypertrophy. Left ventricular diastolic parameters are consistent with Grade I diastolic dysfunction (impaired relaxation). Right Ventricle: The right ventricular size is normal. No increase in right ventricular wall thickness. Right ventricular systolic function is normal. There is normal pulmonary artery systolic pressure. The  tricuspid regurgitant velocity is 2.21 m/s, and  with an assumed right atrial pressure of 3 mmHg, the estimated right ventricular systolic pressure is 22.5 mmHg. Left Atrium: Left atrial size was normal in size. Right Atrium: Right atrial size was normal in size. Pericardium: There is no evidence of pericardial effusion. Mitral Valve: The mitral valve is normal in structure. There is mild calcification of the mitral valve leaflet(s). Mild mitral annular calcification. Trivial mitral valve regurgitation. No evidence of mitral valve stenosis. Tricuspid Valve: The tricuspid valve is normal in structure. Tricuspid valve regurgitation is trivial. Aortic Valve: The aortic valve is tricuspid. There is mild calcification of the aortic valve. Aortic valve regurgitation is mild. Aortic regurgitation  PHT measures 555 msec. No aortic stenosis is present. Aortic valve peak gradient measures 6.6 mmHg. Pulmonic Valve: The pulmonic valve was normal in structure. Pulmonic valve regurgitation is not visualized. Aorta: The aortic root is normal in size and structure. Venous: The inferior vena cava is normal in size with greater than 50% respiratory variability, suggesting right atrial pressure of 3 mmHg. IAS/Shunts: No atrial level shunt detected by color flow Doppler.  LEFT VENTRICLE PLAX 2D LVIDd:         3.60 cm   Diastology LVIDs:         2.50 cm   LV e' medial:    9.64 cm/s LV PW:         0.70 cm   LV E/e' medial:  8.7 LV IVS:        0.60 cm   LV e' lateral:   10.10 cm/s LVOT diam:     2.00 cm   LV E/e' lateral: 8.3 LV SV:         76 LV SV Index:   44 LVOT Area:     3.14 cm  RIGHT VENTRICLE             IVC RV S prime:     16.30 cm/s  IVC diam: 1.70 cm TAPSE (M-mode): 1.9 cm LEFT ATRIUM             Index        RIGHT ATRIUM           Index LA diam:        3.20 cm 1.84 cm/m   RA Area:     14.00 cm LA Vol (A2C):   41.3 ml 23.72 ml/m  RA Volume:   31.40 ml  18.03 ml/m LA Vol (A4C):   47.1 ml 27.05 ml/m LA Biplane Vol: 44.8 ml  25.73 ml/m  AORTIC VALVE AV Area (Vmax): 2.62 cm AV Vmax:        128.50 cm/s AV Peak Grad:   6.6 mmHg LVOT Vmax:      107.00 cm/s LVOT Vmean:     68.400 cm/s LVOT VTI:       0.242 m AI PHT:         555 msec  AORTA Ao Root diam: 3.30 cm Ao Asc diam:  3.20 cm MITRAL VALVE                TRICUSPID VALVE MV Area (PHT): 3.08 cm     TR Peak grad:   19.5 mmHg MV Decel Time: 246 msec     TR Vmax:        221.00 cm/s MR Peak grad: 30.8 mmHg MR Vmax:      277.50 cm/s   SHUNTS MV E velocity: 83.60 cm/s   Systemic VTI:  0.24 m MV A velocity: 100.00 cm/s  Systemic Diam: 2.00 cm MV E/A ratio:  0.84 Dalton McleanMD Electronically signed by Wilfred Lacy Signature Date/Time: 09/22/2022/1:34:56 PM    Final    CT HEAD WO CONTRAST ( )  Result Date: 09/22/2022 CLINICAL DATA:  Follow-up examination for hemorrhagic stroke. EXAM: CT HEAD WITHOUT CONTRAST TECHNIQUE: Contiguous axial images were obtained from the base of the skull through the vertex without intravenous contrast. RADIATION DOSE REDUCTION: This exam was performed according to the departmental dose-optimization program which includes automated exposure control, adjustment of the mA and/or kV according to patient size and/or use of iterative reconstruction technique. COMPARISON:  Comparison made with prior CT from 09/21/2022. FINDINGS: Brain: Previously  identified intraparenchymal hemorrhage positioned at the left occipital lobe again seen. Hemorrhages overall relatively similar in size and morphology measuring approximately 3.9 x 1.5 x 2.5 cm in greatest dimensions (estimated volume 7 mL). No significant surrounding edema or regional mass effect. Trace adjacent subarachnoid hemorrhage is slightly less conspicuous. However, no evident is subdural extension, with small volume blood seen along the left tentorium and likely posterior falx. Subdural component measures no more than 2-3 mm in maximal thickness without significant mass effect. No visible intraventricular  extension. No other new intracranial hemorrhage. No other acute large vessel territory infarct. No mass lesion or midline shift. No hydrocephalus. Vascular: No abnormal hyperdense vessel. Scattered vascular calcifications noted within the carotid siphons. Skull: Scalp soft tissues and calvarium demonstrate no new finding. Sinuses/Orbits: Globes and orbital soft tissues within normal limits. Paranasal sinuses are largely clear. No mastoid effusion. Other: None. IMPRESSION: 1. No significant interval change in size and morphology of left occipital intraparenchymal hemorrhage, estimated volume 7 mL. No significant surrounding edema or regional mass effect. 2. Slight interval decrease in conspicuity of associated trace regional subarachnoid hemorrhage. However, now evident is subdural extension, with small volume blood seen along the left tentorium and likely posterior falx. Subdural component measures no more than 2-3 mm in maximal thickness without significant mass effect. 3. No other new acute intracranial abnormality. Electronically Signed   By: Rise Mu M.D.   On: 09/22/2022 02:11   CT HEAD WO CONTRAST ( )  Result Date: 09/21/2022 CLINICAL DATA:  Headache, neuro deficit EXAM: CT HEAD WITHOUT CONTRAST TECHNIQUE: Contiguous axial images were obtained from the base of the skull through the vertex without intravenous contrast. RADIATION DOSE REDUCTION: This exam was performed according to the departmental dose-optimization program which includes automated exposure control, adjustment of the mA and/or kV according to patient size and/or use of iterative reconstruction technique. COMPARISON:  Same day CT head and CTA head/neck FINDINGS: Brain: New sites of parenchymal hemorrhage centered in the left occipital lobe, likely in the region of infarcted tissue given findings on same day CTA head and neck (Heidelberl Class 1c-2). There is small volume subarachnoid hemorrhage in this region (series 3, image  21). No hydrocephalus. No evidence of intraventricular blood products. Vascular: No hyperdense vessel or unexpected calcification. Skull: Normal. Negative for fracture or focal lesion. Sinuses/Orbits: No middle ear or mastoid effusion. Paranasal sinuses are clear. Bilateral lens replacement. Orbits are otherwise unremarkable. Other: None. IMPRESSION: New sites of parenchymal hemorrhage centered in the left occipital lobe, likely in the region of infarcted tissue given findings on same day CTA head and neck (Heidelberl Class 1c-2). There is also small volume subarachnoid hemorrhage in this region. These results will be called to the ordering clinician or representative by the Radiologist Assistant, and communication documented in the PACS or [employer]. Electronically Signed   By: Lorenza Cambridge M.D.   On: 09/21/2022 18:33   CT ANGIO HEAD NECK W WO CM (CODE STROKE)  Result Date: 09/21/2022 CLINICAL DATA:  Neuro deficit, acute, stroke suspected EXAM: CT ANGIOGRAPHY HEAD AND NECK WITH AND WITHOUT CONTRAST TECHNIQUE: Multidetector CT imaging of the head and neck was performed using the standard protocol during bolus administration of intravenous contrast. Multiplanar CT image reconstructions and MIPs were obtained to evaluate the vascular anatomy. Carotid stenosis measurements (when applicable) are obtained utilizing NASCET criteria, using the distal internal carotid diameter as the denominator. RADIATION DOSE REDUCTION: This exam was performed according to the departmental dose-optimization program which includes automated exposure control,  adjustment of the mA and/or kV according to patient size and/or use of iterative reconstruction technique. CONTRAST:  75mL OMNIPAQUE IOHEXOL 350 MG/ML SOLN COMPARISON:  Same day CT head. FINDINGS: CTA NECK FINDINGS Aortic arch: Great vessel origins are patent. Right carotid system: No evidence of dissection, stenosis (50% or greater), or occlusion. Left carotid system: No  evidence of dissection, stenosis (50% or greater), or occlusion. Vertebral arteries: Codominant. No evidence of dissection, stenosis (50% or greater), or occlusion. Skeleton: No acute abnormality on limited assessment. Other neck: Negative. Upper chest: Visualized lung apices are clear. Review of the MIP images confirms the above findings CTA HEAD FINDINGS Anterior circulation: Bilateral intracranial ICAs, MCAs, and ACAs are patent without proximal hemodynamically significant stenosis. Posterior circulation: Bilateral intradural vertebral arteries, basilar artery and right posterior cerebral artery are patent. Left fetal type PCA. 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 Venous sinuses: As permitted by contrast timing, patent. 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 Findings discussed with Dr. Wilford Corner via telephone at 5:09 PM. Electronically Signed   By: Feliberto Harts M.D.   On: 09/21/2022 17:10   CT HEAD CODE STROKE WO CONTRAST  Result Date: 09/21/2022 CLINICAL DATA:  Code stroke. Neuro deficit, acute, stroke suspected. Right-sided numbness. EXAM: CT HEAD WITHOUT CONTRAST TECHNIQUE: Contiguous axial images were obtained from the base of the skull through the vertex without intravenous contrast. RADIATION DOSE REDUCTION: This exam was performed according to the departmental dose-optimization program which includes automated exposure control, adjustment of the mA and/or kV according to patient size and/or use of iterative reconstruction technique. COMPARISON:  None Available. FINDINGS: Brain: No acute infarct, hemorrhage, or mass lesion is present. Deep brain nuclei are within normal limits. The insular cortex is normal. No acute or focal cortical abnormality is present. The ventricles are of normal size. No significant extraaxial fluid collection is present. The brainstem and  cerebellum are within normal limits. Midline structures are within normal limits. Vascular: No hyperdense vessel or unexpected calcification. Skull: No significant extracranial soft tissue lesion is present. Calvarium is intact. No focal lytic or blastic lesions are present. Sinuses/Orbits: The paranasal sinuses and mastoid air cells are clear. Bilateral lens replacements are noted. Globes and orbits are otherwise unremarkable. ASPECTS [facility] Stroke Program Early CT Score) - Ganglionic level infarction (caudate, lentiform nuclei, internal capsule, insula, M1-M3 cortex): 7/7 - Supraganglionic infarction (M4-M6 cortex): 3/3 Total score (0-10 with 10 being normal): 10/10 IMPRESSION: 1. Negative CT of the head. 2. Aspects is 10/10. The above was relayed via text pager to Dr. Jomarie Longs ZAMMIT on 09/21/2022 at 16:52 . Electronically Signed   By: Marin Roberts M.D.   On: 09/21/2022 16:53       HISTORY OF PRESENT ILLNESS 82 y.o. patient with history of hypertension and hypothyroidism was admitted with acute onset right-sided numbness and weakness.    HOSPITAL COURSE Patient was given TNK in the emergency department for presumed stroke.  CT angiogram showed P1 P2 occlusion on the left, but thrombectomy was not performed due to low NIHSS.  Patient then began to have a headache, and CT head revealed hemorrhagic transformation in the left occipital lobe.  TNK was reversed, and further head CTs were noted to be stable.  MRI revealed small infarcts in the left thalamic capsular region and cerebral cortex in PCA distribution.  Acute Ischemic Infarct:  left occipital lobe stroke s/p TNK with hemorrhagic  transformation and TNK reversal Etiology: Uncertain at this time Code Stroke CT head No acute abnormality. ASPECTS 10.    CTA head & neck critical, nearly occlusive stenosis of left P2 PCA Repeat CT head 7/24: New site of parenchymal hemorrhage centered in left occipital lobe, likely in region of infarcted  tissue Repeat CT head 7/25: No significant interval change in size and morphology of left occipital IPH, estimated volume 7 mL, trace regional subarachnoid hemorrhage and subdural extension MRI similar size of left occipital IPH, with small volume overlying extra-axial hemorrhage, small infarcts in left thalamic capsular region and cerebral cortex in PCA distribution 2D Echo EF 55 to 60%, grade 1 diastolic dysfunction, normal left atrial size, no atrial level shunt LDL 119 HgbA1c 5.2 VTE prophylaxis -SCDs aspirin 81 mg daily prior to admission, now on No antithrombotic as she is status post TNK reversal in the last 24 hours Therapy recommendations: Pending Disposition: Pending   ICH post TNK TNK reversed, 2 doses of cryoprecipitate given Head CT on 7/25 was stable   Hypertension Home meds: Amlodipine 2.5 mg daily Stable Blood Pressure Goal: SBP between 130-150 for 24 hours and then less than 160    Hyperlipidemia Home meds: Atorvastatin 10 mg daily LDL 119, goal < 70 Increase atorvastatin to 40 milligrams daily at discharge Continue statin at discharge  RN Pressure Injury Documentation:     DISCHARGE EXAM  PHYSICAL EXAM General:  Alert, well-nourished, well-developed patient in no acute distress Psych:  Mood and affect appropriate for situation CV: Regular rate and rhythm on monitor Respiratory:  Regular, unlabored respirations on room air GI: Abdomen soft and nontender  NEURO:  Mental Status: AA&Ox3  Speech/Language: speech is without dysarthria or aphasia.  Naming, repetition, fluency, and comprehension intact.  Cranial Nerves:  II: PERRL.  Diminished visual acuity on the right III, IV, VI: EOMI. Eyelids elevate symmetrically.  V: Sensation is intact to light touch and symmetrical to face.  VII: Smile is symmetrical.  VIII: hearing intact to voice. IX, X: Phonation is normal.  VO:[id number] shrug 5/5. XII: tongue is midline without fasciculations. Motor: 5/5  strength to all muscle groups tested.  Tone: is normal and bulk is normal Sensation- Intact to light touch bilaterally but slightly diminished on the right with right-sided tingling Coordination: FTN intact bilaterally.No drift.  Gait- deferred   Discharge Diet       Diet   Diet regular Room service appropriate? Yes with Assist; Fluid consistency: Thin   liquids  DISCHARGE PLAN Disposition: Home No antithrombotic for secondary stroke prevention secondary to hemorrhagic transformation, requesting early appointment in stroke clinic to consider resumption of aspirin Ongoing stroke risk factor control by Primary Care Physician at time of discharge Follow-up PCP Kirby Funk, MD (Inactive) in 2 weeks. Follow-up in [facility] in 4 weeks, office to schedule an appointment.   32 minutes were spent preparing discharge.  Cortney E Ernestina Columbia , MSN, AGACNP-BC [facility] See Amion for schedule and pager information 09/23/2022 12:07 PM  ATTENDING ATTESTATION:  Exam stable. CT this am shows improvement. F/u in stroke clinic for further guidance on aspirin resumption.   Dr. Viviann Spare evaluated pt independently, reviewed imaging, chart, labs. Discussed and formulated plan with the Resident/APP. Changes were made to the note where appropriate. Please see APP/resident note above for details.    Vivien Barretto,MD

## 2022-09-23 NOTE — Discharge Instructions (Signed)
Veronica Webb, you came to the hospital with right sided weakness and tingling.  You were given TNK to treat your stroke, but you soon developed a headache and were found to have a small intracranial hemorrhage.  The TNK was reversed, and you were monitored in the ICU.  You are now stable and ready to be discharged with outpatient PT/OT.

## 2022-09-23 NOTE — TOC CM/SW Note (Signed)
Transition of Care [facility]) - Inpatient Brief Assessment   Patient Details  Name: Veronica Webb MRN: [id number] Date of Birth: 13-Aug-1940  Transition of Care [facility]) CM/SW Contact:    Mearl Latin, LCSW Phone Number: 09/23/2022, 9:57 AM   Clinical Narrative: Patient admitted from home with spouse. TOC following to assist in outpatient therapy referrals.    Transition of Care Asessment: Insurance and Status: Insurance coverage has been reviewed Patient has primary care physician: Yes Home environment has been reviewed: From home Prior level of function:: Independent Prior/Current Home Services: No current home services Social Determinants of Health Reivew: SDOH reviewed no interventions necessary Readmission risk has been reviewed: Yes Transition of care needs: transition of care needs identified, TOC will continue to follow

## 2022-09-28 ENCOUNTER — Telehealth: Payer: Self-pay

## 2022-09-28 DIAGNOSIS — I63532 Cerebral infarction due to unspecified occlusion or stenosis of left posterior cerebral artery: Secondary | ICD-10-CM | POA: Diagnosis not present

## 2022-09-28 DIAGNOSIS — I80292 Phlebitis and thrombophlebitis of other deep vessels of left lower extremity: Secondary | ICD-10-CM | POA: Diagnosis not present

## 2022-09-28 NOTE — Patient Outreach (Signed)
Received a red flag Emmi stroke notification. I have assigned Roshanda Florance, RN to call for follow up and determine if there are any Case Management needs.    Laura Greeson, CBCS, CMAA [facility] Care Management Assistant [facility] Care Management [phone]  

## 2022-09-28 NOTE — Patient Outreach (Signed)
  Emmi Stroke Care Coordination Follow Up  09/28/2022 Name:  Veronica Webb MRN:  [id number] DOB:  06/09/1940  Subjective: Veronica Webb is a 82 y.o. year old female who is a primary care patient of Warson Woods, Marlaine Hind, [place] An Emmi alert was received indicating patient responded to questions: Feeling worse overall?. I reached out by phone to follow up on the alert and spoke to Patient.  Patient voices she is doing fairy well. She went to PCP follow up appt today. She has neuro appt on 11/01/22. Reviewed and addressed red alert. Pt states she responded that way as at the time she was having one of "headache spells and wasn't feeling good." Patient reports she has "good and bad days." She has some lingering headaches and tingling to right side at times that she was told may persist for several wks. She is taking med to help manage and feel it is helping. She discussed with Md today about the pain she having to back of leg were varicose vein is located. MD has ordered further testing and workup to determine tx options. She has been up walking and moving around as well as doing light household duties. Appetite is normal. No issues with elimination. She has supportive spouse in the home to assist her. No further RN CM needs or concerns at this time.   Care Coordination Interventions:  Yes, provided   TOC Interventions Today    Flowsheet Row Most Recent Value  TOC Interventions   TOC Interventions Discussed/Reviewed TOC Interventions Discussed, Post discharge activity limitations per provider       Interventions Today    Flowsheet Row Most Recent Value  Chronic Disease   Chronic disease during today's visit Other  [post stroke mgmt]  General Interventions   General Interventions Discussed/Reviewed General Interventions Discussed, Doctor Visits  Doctor Visits Discussed/Reviewed Doctor Visits Discussed, PCP, Specialist  PCP/Specialist Visits Compliance with follow-up visit  Education Interventions    Education Provided Provided Education  Provided Verbal Education On Nutrition  Nutrition Interventions   Nutrition Discussed/Reviewed Nutrition Discussed, Adding fruits and vegetables, Fluid intake, Increasing proteins, Decreasing salt, Decreasing fats, Supplemental nutrition  Pharmacy Interventions   Pharmacy Dicussed/Reviewed Pharmacy Topics Discussed, Medications and their functions  Safety Interventions   Safety Discussed/Reviewed Safety Discussed        Follow up plan: Advised patient that they would continue to get automated EMMI-Stroke post discharge calls to assess how they are doing following recent hospitalization and will receive a call from a nurse if any of their responses were abnormal. Patient voiced understanding and was appreciative of f/u call.   Encounter Outcome:  Pt. Visit Completed   Alessandra Grout [facility] Care Management Care Management Community Coordinator Direct Phone: [phone] Toll Free: [phone] Fax: [phone]

## 2022-10-04 ENCOUNTER — Telehealth: Payer: Self-pay

## 2022-10-04 NOTE — Patient Outreach (Signed)
Received a red flag Emmi stroke notification. I have assigned Roshanda Florance, RN to call for follow up and determine if there are any Case Management needs.    Jamen Loiseau, CBCS, CMAA [facility] Care Management Assistant [facility] Care Management [phone]  

## 2022-10-04 NOTE — Patient Outreach (Signed)
  Emmi Stroke Care Coordination Follow Up  10/04/2022 Name:  Veronica Webb MRN:  [id number] DOB:  1940/08/15  Subjective: Veronica Webb is a 82 y.o. year old female who is a primary care patient of Veronica Webb, Veronica Webb, [place] An Emmi alert was received indicating patient responded to questions: Lost interest in things they used to enjoy?. I reached out by phone to follow up on the alert and spoke to Patient. She voices she is "not perfect but better." She shares how she has been making small progress in her recovery. Patient stated she has been able to read newspaper daily without having to use magnifying glass which is an improvement in the last few days. She is going Thurs to have ultrasound of leg to evaluate varicose veins and issues she is having. Reviewed and addressed red alert. Error in response. Patient voices some frustrations with automated calls and the process-requested that they be stopped. Advised that RN CM would notify appropriate personnel. She denies any further RN CM needs or concerns at this time.   Care Coordination Interventions:  Yes, provided    TOC Interventions Today    Flowsheet Row Most Recent Value  TOC Interventions   TOC Interventions Discussed/Reviewed TOC Interventions Reviewed      Interventions Today    Flowsheet Row Most Recent Value  Chronic Disease   Chronic disease during today's visit Other  [post stroke mgmt]  General Interventions   General Interventions Discussed/Reviewed General Interventions Reviewed, Doctor Visits  Doctor Visits Discussed/Reviewed Doctor Visits Reviewed, Specialist, PCP  PCP/Specialist Visits Compliance with follow-up visit  Education Interventions   Education Provided Provided Education  Provided Verbal Education On Nutrition, When to see the doctor  Nutrition Interventions   Nutrition Discussed/Reviewed Nutrition Reviewed  Pharmacy Interventions   Pharmacy Dicussed/Reviewed Pharmacy Topics Reviewed, Medications and their  functions  Safety Interventions   Safety Discussed/Reviewed Safety Reviewed, Home Safety        Follow up plan:  Patient has RN CM contact info-are to call for any needs or concerns. RN CM notified [facility] administrative assistant to deactivate EMMI-Stroke calls per pt request.     Encounter Outcome:  Pt. Visit Completed   Alessandra Grout [facility] Care Management Care Management Community Coordinator Direct Phone: [phone] Toll Free: [phone] Fax: [phone]

## 2022-10-12 ENCOUNTER — Emergency Department (HOSPITAL_COMMUNITY)
Admission: EM | Admit: 2022-10-12 | Discharge: 2022-10-12 | Disposition: A | Discharge status: Home / Self Care | Payer: Medicare Other | Attending: Emergency Medicine | Admitting: Emergency Medicine

## 2022-10-12 ENCOUNTER — Other Ambulatory Visit: Payer: Self-pay

## 2022-10-12 ENCOUNTER — Encounter (HOSPITAL_COMMUNITY): Payer: Self-pay

## 2022-10-12 DIAGNOSIS — R2242 Localized swelling, mass and lump, left lower limb: Secondary | ICD-10-CM | POA: Diagnosis present

## 2022-10-12 DIAGNOSIS — I10 Essential (primary) hypertension: Secondary | ICD-10-CM | POA: Insufficient documentation

## 2022-10-12 DIAGNOSIS — I809 Phlebitis and thrombophlebitis of unspecified site: Secondary | ICD-10-CM | POA: Insufficient documentation

## 2022-10-12 DIAGNOSIS — Z79899 Other long term (current) drug therapy: Secondary | ICD-10-CM | POA: Diagnosis not present

## 2022-10-12 DIAGNOSIS — E039 Hypothyroidism, unspecified: Secondary | ICD-10-CM | POA: Diagnosis not present

## 2022-10-12 DIAGNOSIS — I80292 Phlebitis and thrombophlebitis of other deep vessels of left lower extremity: Secondary | ICD-10-CM | POA: Diagnosis not present

## 2022-10-12 DIAGNOSIS — I8002 Phlebitis and thrombophlebitis of superficial vessels of left lower extremity: Secondary | ICD-10-CM | POA: Diagnosis not present

## 2022-10-12 LAB — I-STAT CHEM 8, ED
BUN: 16 mg/dL (ref 8–23)
Calcium, Ion: 1.21 mmol/L (ref 1.15–1.40)
Chloride: 106 mmol/L (ref 98–111)
Creatinine, Ser: 0.9 mg/dL (ref 0.44–1.00)
Glucose, Bld: 93 mg/dL (ref 70–99)
HCT: 39 % (ref 36.0–46.0)
Hemoglobin: 13.3 g/dL (ref 12.0–15.0)
Potassium: 3.7 mmol/L (ref 3.5–5.1)
Sodium: 140 mmol/L (ref 135–145)
TCO2: 22 mmol/L (ref 22–32)

## 2022-10-12 LAB — PROTIME-INR
INR: 0.9 (ref 0.8–1.2)
Prothrombin Time: 12.7 seconds (ref 11.4–15.2)

## 2022-10-12 LAB — CBC
HCT: 38.5 % (ref 36.0–46.0)
Hemoglobin: 13 g/dL (ref 12.0–15.0)
MCH: 33.9 pg (ref 26.0–34.0)
MCHC: 33.8 g/dL (ref 30.0–36.0)
MCV: 100.3 fL — ABNORMAL HIGH (ref 80.0–100.0)
Platelets: 286 10*3/uL (ref 150–400)
RBC: 3.84 MIL/uL — ABNORMAL LOW (ref 3.87–5.11)
RDW: 12.7 % (ref 11.5–15.5)
WBC: 5.7 10*3/uL (ref 4.0–10.5)
nRBC: 0 % (ref 0.0–0.2)

## 2022-10-12 NOTE — Discharge Instructions (Signed)
You have a small superficial clot in your left calf, I recommend keeping elevated and wearing compressions, do this for the next 7 to 10 days, I would like you to follow back up either with your primary doctor and/or vascular surgery for further evaluation  Come back to the emergency department if you develop chest pain, shortness of breath, severe abdominal pain, uncontrolled nausea, vomiting, diarrhea.

## 2022-10-12 NOTE — ED Provider Notes (Signed)
[place] EMERGENCY DEPARTMENT AT [facility] Provider Note   CSN: [id number] Arrival date & time: 10/12/22  1624     History  Chief Complaint  Patient presents with   Leg Swelling    Veronica Webb is a 82 y.o. female.  HPI   Patient with medical history including hypertension, hypothyroidism, left occipital lobe stroke, status post TNK with hemorrhagic transformation and TNK reversal, presenting with left calf pain.  Patient states that she had an ultrasound done today and she was told to come here for further evaluation.  Patient states that she has had a knot in her left calf for the last 3 months, states it is not gotten larger or more painful, she denies any worsening swelling in her left leg, no paresthesias or weakness, she is not endorsing any new chest pain shortness of breath.  She states that she has no other complaints, she was informed that she is needs to come here.  I reviewed patient's chart, had a left occipital stroke, with hemorrhagic transformation received transversal, she was stable and discharged home, she was not placed on antiplatelet therapy due to hemorrhagic stroke.  She was seen at [facility] and ultrasound was performed but there is no results present.  the patient was able to access her patient portal account which shows that she had an ultrasound done stones that it was negative for a DVT but shows a superficial thrombophlebitis involving short saphenous vein in the posterior calf.  Photo has been placed in her chart.    Home Medications Prior to Admission medications   Medication Sig Start Date End Date Taking? Authorizing Provider  amLODipine (NORVASC) 2.5 MG tablet Take 2.5 mg by mouth daily. 12/22/21   [provider]  atorvastatin (LIPITOR) 10 MG tablet Take 10 mg by mouth every evening. Patient not taking: Reported on 09/22/2022    [provider]  butalbital-acetaminophen-caffeine (FIORICET) 50-325-40 MG tablet Take 1 tablet  by mouth every 6 (six) hours as needed for headache. 09/23/22   Harolyn Rutherford, MD  Cholecalciferol (VITAMIN D) 2000 UNITS tablet Take 4,000 Units by mouth daily.    [provider]  estradiol (ESTRACE) 0.5 MG tablet Take 0.5 mg by mouth daily.    [provider]  levothyroxine (SYNTHROID, LEVOTHROID) 25 MCG tablet Take 25 mcg by mouth daily before breakfast.    [provider]      Allergies    Patient has no known allergies.    Review of Systems   Review of Systems  Constitutional:  Negative for chills and fever.  Respiratory:  Negative for shortness of breath.   Cardiovascular:  Positive for leg swelling. Negative for chest pain.  Gastrointestinal:  Negative for abdominal pain.  Neurological:  Negative for headaches.    Physical Exam Updated Vital Signs BP (!) 164/72   Pulse 76   Temp 97.8 F (36.6 C) (Oral)   Resp 18   Ht 5' 5.5" (1.664 m)   Wt 63.5 kg   SpO2 100%   BMI 22.94 kg/m  Physical Exam Vitals and nursing note reviewed.  Constitutional:      General: She is not in acute distress.    Appearance: She is not ill-appearing.  HENT:     Head: Normocephalic and atraumatic.     Nose: No congestion.  Eyes:     Conjunctiva/sclera: Conjunctivae normal.  Cardiovascular:     Rate and Rhythm: Normal rate and regular rhythm.     Pulses:  Normal pulses.  Pulmonary:     Effort: Pulmonary effort is normal.  Musculoskeletal:     Comments: Focused exam of the lower extremities reveals a palpable cord on the left posterior calf, there is no surrounding erythema or edema, there is no unilateral leg swelling, she has 2+ dorsal pedal pulses, motor function grossly intact in the left leg, sensation intact to light touch.  Skin:    General: Skin is warm and dry.  Neurological:     Mental Status: She is alert.  Psychiatric:        Mood and Affect: Mood normal.     ED Results / Procedures / Treatments   Labs (all labs ordered are listed, but only  abnormal results are displayed) Labs Reviewed  CBC - Abnormal; Notable for the following components:      Result Value   RBC 3.84 (*)    MCV 100.3 (*)    All other components within normal limits  PROTIME-INR  I-STAT CHEM 8, ED    EKG None  Radiology No results found.  Procedures Procedures    Medications Ordered in ED Medications - No data to display  ED Course/ Medical Decision Making/ A&P                                 Medical Decision Making  This patient presents to the ED for concern of left leg swelling, this involves an extensive number of treatment options, and is a complaint that carries with it a high risk of complications and morbidity.  The differential diagnosis includes DVT, limb ischemia, cellulitis, infection, compartment syndrome    Additional history obtained:  Additional history obtained from husband at bedside External records from outside source obtained and reviewed including previous ER notes   Co morbidities that complicate the patient evaluation  Intracranial bleed, CVA  Social Determinants of Health:  Geriatric    Lab Tests:  I Ordered, and personally interpreted labs.  The pertinent results include: CBC unremarkable, prothrombin time INR unremarkable, i-STAT unremarkable   Imaging Studies ordered:  I ordered imaging studies including N/A I independently visualized and interpreted imaging which showed N/A I agree with the radiologist interpretation   Cardiac Monitoring:  The patient was maintained on a cardiac monitor.  I personally viewed and interpreted the cardiac monitored which showed an underlying rhythm of: N/A   Medicines ordered and prescription drug management:  I ordered medication including N/A I have reviewed the patients home medicines and have made adjustments as needed  Critical Interventions:  N/A   Reevaluation:  Presents with concerns of a DVT, after reviewing patient's lab workup shows that  she has a superficial venous thrombosis, she has a benign physical exam, she is agreement discharge at this time.  Consultations Obtained:  N/a    Test Considered:  N/a    Rule out I have low suspicion for overlying cellulitis and/or deep tissue infection is no evidence of infection on my exam i.e. no erythema, no edema, presentation atypical of etiology.  I doubt emergent limb ischemia presentation atypical, she does 2+ dorsal pedal pulses, sensation to tact, gross motor function intact, limb is warm to the touch.  Low suspicion for compartment syndrome as area was palpated it was soft to the touch, neurovascular fully intact.     Dispostion and problem list  After consideration of the diagnostic results and the patients response to treatment, I feel that  the patent would benefit from discharge.    Thrombophlebitis-due to patient's hemorrhagic bleed from TNK, will defer on NSAIDs, will recommend compression, keeping elevated, have her follow-up with her primary and/or vascular surgery for further assessment.           Final Clinical Impression(s) / ED Diagnoses Final diagnoses:  Thrombophlebitis    Rx / DC Orders ED Discharge Orders     None         Barnie Del 10/12/22 2316    Mesner, Barbara Cower, MD 10/13/22 [id number]

## 2022-10-12 NOTE — ED Triage Notes (Signed)
Pt presents with swelling to left posterior calf that she has noticed for 6 weeks. She had an [place] today and was told to come to the ER, she reports she is waiting on the results of the scan. She also reports she was seen here on 7/24 for a stroke and was given TNK but had to have it reversed due to small intracranial hemorrhage. She was stabilized in the ICU and discharged on 7/26. She denies having any deficits from the stroke.

## 2022-10-18 ENCOUNTER — Ambulatory Visit (INDEPENDENT_AMBULATORY_CARE_PROVIDER_SITE_OTHER): Payer: Medicare Other | Admitting: Neurology

## 2022-10-18 ENCOUNTER — Encounter: Payer: Self-pay | Admitting: Neurology

## 2022-10-18 VITALS — BP 133/75 | HR 77 | Ht 65.6 in | Wt 142.0 lb

## 2022-10-18 DIAGNOSIS — I63532 Cerebral infarction due to unspecified occlusion or stenosis of left posterior cerebral artery: Secondary | ICD-10-CM | POA: Diagnosis not present

## 2022-10-18 DIAGNOSIS — I639 Cerebral infarction, unspecified: Secondary | ICD-10-CM

## 2022-10-18 MED ORDER — ASPIRIN 81 MG PO TBEC: 81.0000 mg | DELAYED_RELEASE_TABLET | Freq: Every day | ORAL | 12 refills | Status: AC

## 2022-10-18 NOTE — Progress Notes (Signed)
[facility] [address] [place] [address] [phone]       OFFICE CONSULT NOTE  [name] Date of Birth:  1940/12/09 Medical Record Number:  [id number]   Referring MD: Leticia Penna  Reason for Referral: Stroke  HPI: [name] is a pleasant 82 year old Caucasian lady seen today for initial office consultation visit for stroke.  She is accompanied by her husband.  History is obtained from them and review of electronic medical records.  I personally reviewed pertinent available imaging films in PACS.  She has past medical history of hypertension and hypothyroidism.  She presented on 09/21/2022 for evaluation for sudden onset of right-sided numbness and weakness.  She was at a grocery store while walking out she noticed the right face arm and leg suddenly became numb this lasted about 10 minutes and started getting better.  This happened multiple times and was transient.  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  Patient post TNK developed severe headache and stat repeat CT head showed parenchymal hemorrhage in the left occipital lobe and likely in the region of the infarcted tissue.  TNK was reversed with cryoprecipitate.  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  Patient was admitted to the intensive care unit and blood pressure is tightly controlled.  She remained neurologically stable.  MRI scan showed stable appearance of the parenchymal hemorrhage in the left occipital lobe with some associated subarachnoid and subdural blood.  There was small infarct noted in the left thalamocapsular region and left cerebral cortex in the PCA distribution.  Echocardiogram showed ejection fraction of 55 to 60%.  LDL cholesterol elevated 119 mg percent and hemoglobin A1c was 5.2.  She was discharged not on any antiplatelet agent  because of the hemorrhage.  Patient stated she has done well since discharge but she has persistent paresthesias on the right side.  She has some intermittent numbness in the right foot, right rib cage and hand this occurs about once or twice a day but it is troublesome but not quite bothering her.  She is noticed some increasing reading difficulties since her stroke but has not yet seen an ophthalmologist or optometrist for the same.  She does have some mild short-term memory difficulties but these even were present before the stroke and have not gotten worse.  Patient was started on Lipitor which she did not tolerate and hence was switched to Crestor which she seems to be tolerating well so far but has not yet had a follow-up lipid profile checked.  She has discontinued Estrace which she was taking at the time of the stroke and has not had any withdrawal symptoms and does not want to go back on it.  She has a colonoscopy scheduled but is willing to postpone it if she has to start taking aspirin.  She denies any peripheral vision loss gait or balance problems or recurrent stroke TIA symptoms.  She has no history of palpitations or passing out though her sister was diagnosed with A-fib a few years ago  ROS:   14 system review of systems is positive for numbness, tingling, bruising, memory difficulties all other systems negative  PMH:  Past Medical History:  Diagnosis Date   Hypothyroidism     Social History:  Social History   Socioeconomic History   Marital status: Married    Spouse name: Not  on file   Number of children: Not on file   Years of education: Not on file   Highest education level: Not on file  Occupational History   Not on file  Tobacco Use   Smoking status: Never   Smokeless tobacco: Never  Substance and Sexual Activity   Alcohol use: Yes    Comment:  2 glasses of wind per day   Drug use: No   Sexual activity: Not on file  Other Topics Concern   Not on file  Social  History Narrative   Not on file   Social Determinants of Health   Financial Resource Strain: Not on file  Food Insecurity: Not on file  Transportation Needs: Not on file  Physical Activity: Not on file  Stress: Not on file  Social Connections: Not on file  Intimate Partner Violence: Not on file    Medications:   Current Outpatient Medications on File Prior to Visit  Medication Sig Dispense Refill   amLODipine (NORVASC) 2.5 MG tablet Take 2.5 mg by mouth daily.     butalbital-acetaminophen-caffeine (FIORICET) 50-325-40 MG tablet Take 1 tablet by mouth every 6 (six) hours as needed for headache. 14 tablet 0   Cholecalciferol (VITAMIN D) 2000 UNITS tablet Take 2,000 Units by mouth daily.     levothyroxine (SYNTHROID, LEVOTHROID) 25 MCG tablet Take 25 mcg by mouth daily before breakfast.     rosuvastatin (CRESTOR) 10 MG tablet Take 10 mg by mouth daily.     No current facility-administered medications on file prior to visit.    Allergies:   Allergies  Allergen Reactions   Lipitor [Atorvastatin] Other (See Comments)    Muscle weakness    Physical Exam General: well developed, well nourished pleasant elderly Caucasian lady, seated, in no evident distress Head: head normocephalic and atraumatic.   Neck: supple with no carotid or supraclavicular bruits Cardiovascular: regular rate and rhythm, no murmurs Musculoskeletal: no deformity Skin:  no rash/petichiae Vascular:  Normal pulses all extremities  Neurologic Exam Mental Status: Awake and fully alert. Oriented to place and time. Recent and remote memory intact. Attention span, concentration and fund of knowledge appropriate. Mood and affect appropriate.  Cranial Nerves: Fundoscopic exam reveals sharp disc margins. Pupils equal, briskly reactive to light. Extraocular movements full without nystagmus. Visual fields full to confrontation. Hearing intact. Facial sensation intact. Face, tongue, palate moves normally and symmetrically.   Motor: Normal bulk and tone. Normal strength in all tested extremity muscles. Sensory.:  Patchy diminished touch pinprick sensation in the right hand, rib cage and and foot. Coordination: Rapid alternating movements normal in all extremities. Finger-to-nose and heel-to-shin performed accurately bilaterally. Gait and Station: Arises from chair without difficulty. Stance is normal. Gait demonstrates normal stride length and balance . Able to heel, toe and tandem walk with mild difficulty.  Reflexes: 1+ and symmetric. Toes downgoing.   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  Vascular risk factors of hyperlipidemia and hypertension only.     PLAN:I had a long d/w patient about his recent stroke, risk for recurrent stroke/TIAs, personally independently reviewed imaging studies and stroke evaluation results and answered questions.Start aspirin 81 mg daily  for secondary stroke prevention and maintain strict control of hypertension with blood pressure goal below 130/90, diabetes with hemoglobin A1c goal  below 6.5% and lipids with LDL cholesterol goal below 70 mg/dL. I also advised the patient to eat a healthy diet with plenty of whole grains, cereals, fruits and vegetables, exercise regularly and maintain ideal body weight .referred to cardiac electrophysiology for loop recorder insertion for paroxysmal A-fib.  Patient has a scheduled colonoscopy would recommend she postpone that for at least 3 months since her stroke unless it is emergent.  Check repeat lipid profile in 2 months.  Followup in the future with me in the future in 4 months or call earlier if necessary. Greater than 50% time during this 45-minute consultation visit was spent on counseling and coordination of care about her  cryptogenic stroke and residual paresthesias about evaluation and treatment plan and answering questions. Delia Heady, MD Note: This document was prepared with digital dictation and possible smart phrase technology. Any transcriptional errors that result from this process are unintentional.

## 2022-10-18 NOTE — Patient Instructions (Signed)
I had a long d/w patient about his recent stroke, risk for recurrent stroke/TIAs, personally independently reviewed imaging studies and stroke evaluation results and answered questions.Start aspirin 81 mg daily  for secondary stroke prevention and maintain strict control of hypertension with blood pressure goal below 130/90, diabetes with hemoglobin A1c goal below 6.5% and lipids with LDL cholesterol goal below 70 mg/dL. I also advised the patient to eat a healthy diet with plenty of whole grains, cereals, fruits and vegetables, exercise regularly and maintain ideal body weight .referred to cardiac electrophysiology for loop recorder insertion for paroxysmal A-fib.  Patient has a scheduled colonoscopy would recommend she postpone that for at least 3 months since her stroke unless it is emergent.  Check repeat lipid profile in 2 months.  Followup in the future with me in the future in 4 months or call earlier if necessary.  Stroke Prevention Some medical conditions and behaviors can lead to a higher chance of having a stroke. You can help prevent a stroke by eating healthy, exercising, not smoking, and managing any medical conditions you have. Stroke is a leading cause of functional impairment. Primary prevention is particularly important because a majority of strokes are first-time events. Stroke changes the lives of not only those who experience a stroke but also their family and other caregivers. How can this condition affect me? A stroke is a medical emergency and should be treated right away. A stroke can lead to brain damage and can sometimes be life-threatening. If a person gets medical treatment right away, there is a better chance of surviving and recovering from a stroke. What can increase my risk? The following medical conditions may increase your risk of a stroke: Cardiovascular disease. High blood pressure (hypertension). Diabetes. High cholesterol. Sickle cell disease. Blood clotting  disorders (hypercoagulable state). Obesity. Sleep disorders (obstructive sleep apnea). Other risk factors include: Being older than age 11. Having a history of blood clots, stroke, or mini-stroke (transient ischemic attack, TIA). Genetic factors, such as race, ethnicity, or a family history of stroke. Smoking cigarettes or using other tobacco products. Taking birth control pills, especially if you also use tobacco. Heavy use of alcohol or drugs, especially cocaine and methamphetamine. Physical inactivity. What actions can I take to prevent this? Manage your health conditions High cholesterol levels. Eating a healthy diet is important for preventing high cholesterol. If cholesterol cannot be managed through diet alone, you may need to take medicines. Take any prescribed medicines to control your cholesterol as told by your health care provider. Hypertension. To reduce your risk of stroke, try to keep your blood pressure below 130/80. Eating a healthy diet and exercising regularly are important for controlling blood pressure. If these steps are not enough to manage your blood pressure, you may need to take medicines. Take any prescribed medicines to control hypertension as told by your health care provider. Ask your health care provider if you should monitor your blood pressure at home. Have your blood pressure checked every year, even if your blood pressure is normal. Blood pressure increases with age and some medical conditions. Diabetes. Eating a healthy diet and exercising regularly are important parts of managing your blood sugar (glucose). If your blood sugar cannot be managed through diet and exercise, you may need to take medicines. Take any prescribed medicines to control your diabetes as told by your health care provider. Get evaluated for obstructive sleep apnea. Talk to your health care provider about getting a sleep evaluation if you snore a lot  or have excessive sleepiness. Make  sure that any other medical conditions you have, such as atrial fibrillation or atherosclerosis, are managed. Nutrition Follow instructions from your health care provider about what to eat or drink to help manage your health condition. These instructions may include: Reducing your daily calorie intake. Limiting how much salt (sodium) you use to 1,500 milligrams (mg) each day. Using only healthy fats for cooking, such as olive oil, canola oil, or sunflower oil. Eating healthy foods. You can do this by: Choosing foods that are high in fiber, such as whole grains, and fresh fruits and vegetables. Eating at least 5 servings of fruits and vegetables a day. Try to fill one-half of your plate with fruits and vegetables at each meal. Choosing lean protein foods, such as lean cuts of meat, poultry without skin, fish, tofu, beans, and nuts. Eating low-fat dairy products. Avoiding foods that are high in sodium. This can help lower blood pressure. Avoiding foods that have saturated fat, trans fat, and cholesterol. This can help prevent high cholesterol. Avoiding processed and prepared foods. Counting your daily carbohydrate intake.  Lifestyle If you drink alcohol: Limit how much you have to: 0-1 drink a day for women who are not pregnant. 0-2 drinks a day for men. Know how much alcohol is in your drink. In the [place] one drink equals one 12 oz bottle of beer ( ), one 5 oz glass of wine ( ), or one 1 oz glass of hard liquor (44mL). Do not use any products that contain nicotine or tobacco. These products include cigarettes, chewing tobacco, and vaping devices, such as e-cigarettes. If you need help quitting, ask your health care provider. Avoid secondhand smoke. Do not use drugs. Activity  Try to stay at a healthy weight. Get at least 30 minutes of exercise on most days, such as: Fast walking. Biking. Swimming. Medicines Take over-the-counter and prescription medicines only as told by your  health care provider. Aspirin or blood thinners (antiplatelets or anticoagulants) may be recommended to reduce your risk of forming blood clots that can lead to stroke. Avoid taking birth control pills. Talk to your health care provider about the risks of taking birth control pills if: You are over 57 years old. You smoke. You get very bad headaches. You have had a blood clot. Where to find more information American Stroke Association: www.strokeassociation.org Get help right away if: You or a loved one has any symptoms of a stroke. "BE FAST" is an easy way to remember the main warning signs of a stroke: B - Balance. Signs are dizziness, sudden trouble walking, or loss of balance. E - Eyes. Signs are trouble seeing or a sudden change in vision. F - Face. Signs are sudden weakness or numbness of the face, or the face or eyelid drooping on one side. A - Arms. Signs are weakness or numbness in an arm. This happens suddenly and usually on one side of the body. S - Speech. Signs are sudden trouble speaking, slurred speech, or trouble understanding what people say. T - Time. Time to call emergency services. Write down what time symptoms started. You or a loved one has other signs of a stroke, such as: A sudden, severe headache with no known cause. Nausea or vomiting. Seizure. These symptoms may represent a serious problem that is an emergency. Do not wait to see if the symptoms will go away. Get medical help right away. Call your local emergency services (911 in the [place] Do not drive yourself to  the hospital. Summary You can help to prevent a stroke by eating healthy, exercising, not smoking, limiting alcohol intake, and managing any medical conditions you may have. Do not use any products that contain nicotine or tobacco. These include cigarettes, chewing tobacco, and vaping devices, such as e-cigarettes. If you need help quitting, ask your health care provider. Remember "BE FAST" for warning  signs of a stroke. Get help right away if you or a loved one has any of these signs. This information is not intended to replace advice given to you by your health care provider. Make sure you discuss any questions you have with your health care provider. Document Revised: 01/17/2022 Document Reviewed: 01/17/2022 Elsevier Patient Education  2024 [employer].

## 2022-11-01 ENCOUNTER — Ambulatory Visit: Payer: BLUE CROSS/BLUE SHIELD | Admitting: Diagnostic Neuroimaging

## 2022-11-16 ENCOUNTER — Telehealth: Payer: Self-pay | Admitting: Neurology

## 2022-11-16 DIAGNOSIS — I63532 Cerebral infarction due to unspecified occlusion or stenosis of left posterior cerebral artery: Secondary | ICD-10-CM

## 2022-11-16 NOTE — Telephone Encounter (Signed)
Order has been released and should now be able to be see at the other [facility] to have it drawn.

## 2022-11-16 NOTE — Telephone Encounter (Signed)
Pt reports that the requested lab work of Dr Pearlean Brownie was not showing at the [facility] that she went to, she is asking the order be placed for the [facility] location in [place]

## 2022-11-22 DIAGNOSIS — I63532 Cerebral infarction due to unspecified occlusion or stenosis of left posterior cerebral artery: Secondary | ICD-10-CM | POA: Diagnosis not present

## 2022-11-23 LAB — LIPID PANEL
Chol/HDL Ratio: 2.2 ratio (ref 0.0–4.4)
Cholesterol, Total: 159 mg/dL (ref 100–199)
HDL: 73 mg/dL (ref >39)
LDL Chol Calc (NIH): 66 mg/dL (ref 0–99)
Triglycerides: 115 mg/dL (ref 0–149)
VLDL Cholesterol Cal: 20 mg/dL (ref 5–40)

## 2022-11-25 NOTE — Progress Notes (Signed)
Kindly inform the patient that cholesterol profile is satisfactory

## 2022-12-01 DIAGNOSIS — Z23 Encounter for immunization: Secondary | ICD-10-CM | POA: Diagnosis not present

## 2022-12-13 ENCOUNTER — Ambulatory Visit: Payer: Medicare Other | Admitting: Cardiovascular Disease

## 2023-01-02 ENCOUNTER — Other Ambulatory Visit: Payer: Self-pay

## 2023-01-02 NOTE — Patient Outreach (Signed)
Telephone outreach to patient to obtain mRS was successfully completed. MRS= 1  S[place] Care Management Assistant [phone] 

## 2023-01-09 ENCOUNTER — Institutional Professional Consult (permissible substitution): Payer: Medicare Other | Admitting: Cardiology

## 2023-01-09 ENCOUNTER — Ambulatory Visit: Payer: BLUE CROSS/BLUE SHIELD | Admitting: Neurology

## 2023-01-17 DIAGNOSIS — E039 Hypothyroidism, unspecified: Secondary | ICD-10-CM | POA: Diagnosis not present

## 2023-01-17 DIAGNOSIS — Z8673 Personal history of transient ischemic attack (TIA), and cerebral infarction without residual deficits: Secondary | ICD-10-CM | POA: Diagnosis not present

## 2023-01-17 DIAGNOSIS — I10 Essential (primary) hypertension: Secondary | ICD-10-CM | POA: Diagnosis not present

## 2023-01-17 DIAGNOSIS — R002 Palpitations: Secondary | ICD-10-CM | POA: Diagnosis not present

## 2023-01-17 DIAGNOSIS — E7800 Pure hypercholesterolemia, unspecified: Secondary | ICD-10-CM | POA: Diagnosis not present

## 2023-01-17 DIAGNOSIS — R202 Paresthesia of skin: Secondary | ICD-10-CM | POA: Diagnosis not present

## 2023-01-17 DIAGNOSIS — E538 Deficiency of other specified B group vitamins: Secondary | ICD-10-CM | POA: Diagnosis not present

## 2023-02-06 ENCOUNTER — Ambulatory Visit: Payer: Medicare Other | Attending: Cardiovascular Disease | Admitting: Cardiology

## 2023-02-06 ENCOUNTER — Ambulatory Visit (INDEPENDENT_AMBULATORY_CARE_PROVIDER_SITE_OTHER): Payer: Medicare Other

## 2023-02-06 ENCOUNTER — Other Ambulatory Visit: Payer: Self-pay | Admitting: Cardiology

## 2023-02-06 ENCOUNTER — Encounter: Payer: Self-pay | Admitting: Cardiology

## 2023-02-06 VITALS — BP 110/70 | HR 74 | Ht 65.6 in | Wt 145.8 lb

## 2023-02-06 DIAGNOSIS — I480 Paroxysmal atrial fibrillation: Secondary | ICD-10-CM | POA: Diagnosis not present

## 2023-02-06 DIAGNOSIS — I639 Cerebral infarction, unspecified: Secondary | ICD-10-CM | POA: Diagnosis present

## 2023-02-06 DIAGNOSIS — R9431 Abnormal electrocardiogram [ECG] [EKG]: Secondary | ICD-10-CM

## 2023-02-06 NOTE — Progress Notes (Unsigned)
Enrolled for [employer] to mail a ZIO XT long term holter monitor to the patients address on file.  

## 2023-02-06 NOTE — Patient Instructions (Signed)
Medication Instructions:  Your physician recommends that you continue on your current medications as directed. Please refer to the Current Medication list given to you today.  *If you need a refill on your cardiac medications before your next appointment, please call your pharmacy*  Testing/Procedures: Your physician has recommended that you wear an event monitor. Event monitors are medical devices that record the heart's electrical activity. Doctors most often [place] these monitors to diagnose arrhythmias. Arrhythmias are problems with the speed or rhythm of the heartbeat. The monitor is a small, portable device. You can wear one while you do your normal daily activities. This is usually used to diagnose what is causing palpitations/syncope (passing out).  Follow-Up: At [facility], you and your health needs are our priority.  As part of our continuing mission to provide you with exceptional heart care, we have created designated Provider Care Teams.  These Care Teams include your primary Cardiologist (physician) and Advanced Practice Providers (APPs -  Physician Assistants and Nurse Practitioners) who all work together to provide you with the care you need, when you need it.  Your next appointment:   Based on results of testing  ZIO XT- Long Term Monitor Instructions  Your physician has requested you wear a ZIO patch monitor for 14 days.  This is a single patch monitor. [employer] supplies one patch monitor per enrollment. Additional stickers are not available. Please do not apply patch if you will be having a Nuclear Stress Test,  Echocardiogram, Cardiac CT, MRI, or Chest Xray during the period you would be wearing the  monitor. The patch cannot be worn during these tests. You cannot remove and re-apply the  ZIO XT patch monitor.  Your ZIO patch monitor will be mailed 3 day USPS to your address on file. It may take 3-5 days  to receive your monitor after you have been enrolled.  Once  you have received your monitor, please review the enclosed instructions. Your monitor  has already been registered assigning a specific monitor serial # to you.  Billing and Patient Assistance Program Information  We have supplied [employer] with any of your insurance information on file for billing purposes. [employer] offers a sliding scale Patient Assistance Program for patients that do not have  insurance, or whose insurance does not completely cover the cost of the ZIO monitor.  You must apply for the Patient Assistance Program to qualify for this discounted rate.  To apply, please call [employer] at [phone], select option 4, select option 2, ask to apply for  Patient Assistance Program. Meredeth Ide will ask your household income, and how many people  are in your household. They will quote your out-of-pocket cost based on that information.  [employer] will also be able to set up a 15-month, interest-free payment plan if needed.  Applying the monitor   Shave hair from upper left chest.  Hold abrader disc by orange tab. Rub abrader in 40 strokes over the upper left chest as  indicated in your monitor instructions.  Clean area with 4 enclosed alcohol pads. Let dry.  Apply patch as indicated in monitor instructions. Patch will be placed under collarbone on left  side of chest with arrow pointing upward.  Rub patch adhesive wings for 2 minutes. Remove white label marked "1". Remove the white  label marked "2". Rub patch adhesive wings for 2 additional minutes.  While looking in a mirror, press and release button in center of patch. A small green light will  flash 3-4  times. This will be your only indicator that the monitor has been turned on.  Do not shower for the first 24 hours. You may shower after the first 24 hours.  Press the button if you feel a symptom. You will hear a small click. Record Date, Time and  Symptom in the Patient Logbook.  When you are ready to remove the patch, follow  instructions on the last 2 pages of Patient  Logbook. Stick patch monitor onto the last page of Patient Logbook.  Place Patient Logbook in the blue and white box. Use locking tab on box and tape box closed  securely. The blue and white box has prepaid postage on it. Please place it in the mailbox as  soon as possible. Your physician should have your test results approximately 7 days after the  monitor has been mailed back to [facility].  Call [facility] Customer Care at [phone] if you have questions regarding  your ZIO XT patch monitor. Call them immediately if you see an orange light blinking on your  monitor.  If your monitor falls off in less than 4 days, contact our Monitor department at [phone].  If your monitor becomes loose or falls off after 4 days call [employer] at [phone] for  suggestions on securing your monitor

## 2023-02-06 NOTE — Progress Notes (Signed)
  Electrophysiology Office Note:   Date:  02/06/2023  ID:  VANELY SHARBER, DOB 11/30/1940, MRN [id number]  Primary Cardiologist: None Electrophysiologist: Nobie Putnam, MD      History of Present Illness:   AYTANA LEBOEUF is a 82 y.o. female with h/o hypertension, hypothyroidism, and stroke who is being seen today for evaluation for loop recorder implant at the request of Dr. Pearlean Brownie with Neurology.  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 Patient has recovered relatively well. Has some right sided neuropathy since stroke which is bothersome to her. She is now taking gabapentin for this. She denies any known history of atrial fibrillation. Denies any history of sustained palpitations. Her sister did have a stroke as well and was diagnosed with AF after wearing an external cardiac monitor.   Review of systems complete and found to be negative unless listed in HPI.   EP Information / Studies Reviewed:    EKG is ordered today. Personal review as below. Normal sinus rhythm.      Echo 09/21/22: Normal LV size and function.  LVEF 55 to 60%.  Grade 1 diastolic dysfunction. Normal RV size and function. No significant valvular disease.   Physical Exam:   VS:  BP 110/70 (BP Location: Right Arm, Patient Position: Sitting, Cuff Size: Normal)   Pulse 74   Ht 5' 5.6" (1.666 m)   Wt 145 lb 12.8 oz (66.1 kg)   SpO2 97%   BMI 23.82 kg/m    Wt Readings from Last 3 Encounters:  02/06/23 145 lb 12.8 oz (66.1 kg)  10/18/22 142 lb (64.4 kg)  10/12/22 140 lb (63.5 kg)     GEN: Well nourished, well developed in no acute distress NECK: No JVD.  CARDIAC: Normal rate, regular rhythm.  RESPIRATORY:  Normal work of breathing.  ABDOMEN: Soft, non-tender, non-distended EXTREMITIES:  No edema; No deformity   ASSESSMENT AND PLAN:   ALYZZA PUCK is a 82 y.o. female with h/o hypertension, hypothyroidism, and left PCA stroke who is being seen today for evaluation  for loop recorder implant at the request of Dr. Pearlean Brownie with Neurology.  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 Neurology requesting loop recorder to monitor for occult AF.  - Explained risks, benefits, and alternatives to ILR  implantation. Patient would like to pursue an external cardiac monitor first and will think about whether or not she wants an ILR in the interim, pending results of Zio. - Zio monitor ordered.   Follow up with EP if Zio abnormal or if patient decides she would like loop implanted.   Total time of encounter: 54 minutes total time of encounter, including face-to-face patient care, coordination of care and counseling regarding high complexity medical decision making.   Signed, Nobie Putnam, MD

## 2023-02-13 DIAGNOSIS — I10 Essential (primary) hypertension: Secondary | ICD-10-CM | POA: Diagnosis not present

## 2023-02-13 DIAGNOSIS — R202 Paresthesia of skin: Secondary | ICD-10-CM | POA: Diagnosis not present

## 2023-02-13 DIAGNOSIS — E538 Deficiency of other specified B group vitamins: Secondary | ICD-10-CM | POA: Diagnosis not present

## 2023-02-13 DIAGNOSIS — R718 Other abnormality of red blood cells: Secondary | ICD-10-CM | POA: Diagnosis not present

## 2023-02-13 DIAGNOSIS — D510 Vitamin B12 deficiency anemia due to intrinsic factor deficiency: Secondary | ICD-10-CM | POA: Diagnosis not present

## 2023-02-13 DIAGNOSIS — E785 Hyperlipidemia, unspecified: Secondary | ICD-10-CM | POA: Diagnosis not present

## 2023-03-06 DIAGNOSIS — I480 Paroxysmal atrial fibrillation: Secondary | ICD-10-CM

## 2023-03-06 DIAGNOSIS — R9431 Abnormal electrocardiogram [ECG] [EKG]: Secondary | ICD-10-CM | POA: Diagnosis not present

## 2023-03-06 DIAGNOSIS — I639 Cerebral infarction, unspecified: Secondary | ICD-10-CM | POA: Diagnosis not present

## 2023-03-09 DIAGNOSIS — E538 Deficiency of other specified B group vitamins: Secondary | ICD-10-CM | POA: Diagnosis not present

## 2023-03-09 DIAGNOSIS — D7589 Other specified diseases of blood and blood-forming organs: Secondary | ICD-10-CM | POA: Diagnosis not present

## 2023-03-13 DIAGNOSIS — K219 Gastro-esophageal reflux disease without esophagitis: Secondary | ICD-10-CM | POA: Diagnosis not present

## 2023-03-13 DIAGNOSIS — D510 Vitamin B12 deficiency anemia due to intrinsic factor deficiency: Secondary | ICD-10-CM | POA: Diagnosis not present

## 2023-03-24 DIAGNOSIS — I480 Paroxysmal atrial fibrillation: Secondary | ICD-10-CM | POA: Diagnosis not present

## 2023-03-24 DIAGNOSIS — R9431 Abnormal electrocardiogram [ECG] [EKG]: Secondary | ICD-10-CM | POA: Diagnosis not present

## 2023-03-25 ENCOUNTER — Encounter: Payer: Self-pay | Admitting: Cardiology

## 2023-04-10 DIAGNOSIS — G4761 Periodic limb movement disorder: Secondary | ICD-10-CM | POA: Diagnosis not present

## 2023-04-10 DIAGNOSIS — R300 Dysuria: Secondary | ICD-10-CM | POA: Diagnosis not present

## 2023-05-11 DIAGNOSIS — L718 Other rosacea: Secondary | ICD-10-CM | POA: Diagnosis not present

## 2023-05-11 DIAGNOSIS — D2261 Melanocytic nevi of right upper limb, including shoulder: Secondary | ICD-10-CM | POA: Diagnosis not present

## 2023-05-11 DIAGNOSIS — Z85828 Personal history of other malignant neoplasm of skin: Secondary | ICD-10-CM | POA: Diagnosis not present

## 2023-05-11 DIAGNOSIS — L400 Psoriasis vulgaris: Secondary | ICD-10-CM | POA: Diagnosis not present

## 2023-05-11 DIAGNOSIS — L821 Other seborrheic keratosis: Secondary | ICD-10-CM | POA: Diagnosis not present

## 2023-05-11 DIAGNOSIS — D485 Neoplasm of uncertain behavior of skin: Secondary | ICD-10-CM | POA: Diagnosis not present

## 2023-05-11 DIAGNOSIS — L814 Other melanin hyperpigmentation: Secondary | ICD-10-CM | POA: Diagnosis not present

## 2023-05-11 DIAGNOSIS — L820 Inflamed seborrheic keratosis: Secondary | ICD-10-CM | POA: Diagnosis not present

## 2023-05-11 DIAGNOSIS — D1801 Hemangioma of skin and subcutaneous tissue: Secondary | ICD-10-CM | POA: Diagnosis not present

## 2023-05-11 DIAGNOSIS — D0471 Carcinoma in situ of skin of right lower limb, including hip: Secondary | ICD-10-CM | POA: Diagnosis not present

## 2023-05-11 DIAGNOSIS — D224 Melanocytic nevi of scalp and neck: Secondary | ICD-10-CM | POA: Diagnosis not present

## 2023-05-15 ENCOUNTER — Ambulatory Visit (INDEPENDENT_AMBULATORY_CARE_PROVIDER_SITE_OTHER): Payer: Medicare Other | Admitting: Neurology

## 2023-05-15 ENCOUNTER — Encounter: Payer: Self-pay | Admitting: Neurology

## 2023-05-15 VITALS — BP 141/78 | HR 86 | Ht 65.5 in | Wt 148.2 lb

## 2023-05-15 DIAGNOSIS — R413 Other amnesia: Secondary | ICD-10-CM | POA: Diagnosis not present

## 2023-05-15 DIAGNOSIS — I63532 Cerebral infarction due to unspecified occlusion or stenosis of left posterior cerebral artery: Secondary | ICD-10-CM

## 2023-05-15 DIAGNOSIS — R202 Paresthesia of skin: Secondary | ICD-10-CM

## 2023-05-15 MED ORDER — TOPIRAMATE 25 MG PO TABS
25.0000 mg | ORAL_TABLET | Freq: Two times a day (BID) | ORAL | 3 refills | Status: DC
Start: 2023-05-15 — End: 2023-12-06

## 2023-05-15 NOTE — Progress Notes (Signed)
 [facility] [address] [place]. [place] [address] [phone]       OFFICE FOLLOW-UP VISIT NOTE  [name] Date of Birth:  1940/07/14 Medical Record Number:  [id number]   Referring MD: Leticia Penna  Reason for Referral: Stroke  HPI: Initial visit 10/18/2022 [name] is a pleasant 83 year old Caucasian lady seen today for initial office consultation visit for stroke.  She is accompanied by her husband.  History is obtained from them and review of electronic medical records.  I personally reviewed pertinent available imaging films in PACS.  She has past medical history of hypertension and hypothyroidism.  She presented on 09/21/2022 for evaluation for sudden onset of right-sided numbness and weakness.  She was at a grocery store while walking out she noticed the right face arm and leg suddenly became numb this lasted about 10 minutes and started getting better.  This happened multiple times and was transient.  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  Patient post TNK developed severe headache and stat repeat CT head showed parenchymal hemorrhage in the left occipital lobe and likely in the region of the infarcted tissue.  TNK was reversed with cryoprecipitate.  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  Patient was admitted to the intensive care unit and blood pressure is tightly controlled.  She remained neurologically stable.  MRI scan showed stable appearance of the parenchymal hemorrhage in the left occipital lobe with some associated subarachnoid and subdural blood.  There was small infarct noted in the left thalamocapsular region and left cerebral cortex in the PCA distribution.  Echocardiogram showed ejection fraction of 55 to 60%.  LDL cholesterol elevated 119 mg percent and hemoglobin A1c was 5.2.  She was discharged  not on any antiplatelet agent because of the hemorrhage.  Patient stated she has done well since discharge but she has persistent paresthesias on the right side.  She has some intermittent numbness in the right foot, right rib cage and hand this occurs about once or twice a day but it is troublesome but not quite bothering her.  She is noticed some increasing reading difficulties since her stroke but has not yet seen an ophthalmologist or optometrist for the same.  She does have some mild short-term memory difficulties but these even were present before the stroke and have not gotten worse.  Patient was started on Lipitor which she did not tolerate and hence was switched to Crestor which she seems to be tolerating well so far but has not yet had a follow-up lipid profile checked.  She has discontinued Estrace which she was taking at the time of the stroke and has not had any withdrawal symptoms and does not want to go back on it.  She has a colonoscopy scheduled but is willing to postpone it if she has to start taking aspirin.  She denies any peripheral vision loss gait or balance problems or recurrent stroke TIA symptoms.  She has no history of palpitations or passing out though her sister was diagnosed with A-fib a few years ago Update 05/15/2023 : She returns for follow-up after last visit 7 months ago.  She is accompanied by her husband.  She continues to have significant paresthesias in the right side.  Mostly in the right leg but at times they can go up involving the right rib cage as well as the  hand.  These appear intermittently but occur daily.  They are not constant.  She tried taking gabapentin 100 mg but started having nightmares and hence stopped it.  She complains of mild short-term memory difficulties but these are not progressive or bothersome.  She did undergo a lipid profile check on 11/22/2022 and LDL cholesterol was optimal at 66 mg percent.  I had referred the patient to electrophysiology for  loop recorder implant but patient after discussion with them decided to get a 30-day heart monitor which was done from February 06, 2023 to March 24, 2023 and was negative for paroxysmal A-fib.  She has had no recurrent stroke or TIA symptoms.  She continues on aspirin which is tolerating well with minor bruising and no bleeding.  She is tolerating Crestor well without muscle aches and pains.  She states her blood pressure under good control. ROS:   14 system review of systems is positive for numbness, tingling, bruising, memory difficulties all other systems negative  PMH:  Past Medical History:  Diagnosis Date   Hypothyroidism     Social History:  Social History   Socioeconomic History   Marital status: Married    Spouse name: Not on file   Number of children: Not on file   Years of education: Not on file   Highest education level: Not on file  Occupational History   Not on file  Tobacco Use   Smoking status: Never   Smokeless tobacco: Never  Vaping Use   Vaping status: Never Used  Substance and Sexual Activity   Alcohol use: Yes    Comment: 2 glasses of wine per day   Drug use: No   Sexual activity: Not on file  Other Topics Concern   Not on file  Social History Narrative   Not on file   Social Drivers of Health   Financial Resource Strain: Not on file  Food Insecurity: Not on file  Transportation Needs: Not on file  Physical Activity: Not on file  Stress: Not on file  Social Connections: Not on file  Intimate Partner Violence: Not on file    Medications:   Current Outpatient Medications on File Prior to Visit  Medication Sig Dispense Refill   amLODipine (NORVASC) 2.5 MG tablet Take 2.5 mg by mouth daily.     aspirin EC 81 MG tablet Take 1 tablet (81 mg total) by mouth daily. Swallow whole. 90 each 12   BIOTIN FORTE PO Take by mouth.     Cholecalciferol (VITAMIN D) 2000 UNITS tablet Take 2,000 Units by mouth daily.     levothyroxine (SYNTHROID, LEVOTHROID) 25  MCG tablet Take 25 mcg by mouth daily before breakfast.     pantoprazole (PROTONIX) 40 MG tablet Take 40 mg by mouth daily.     rosuvastatin (CRESTOR) 10 MG tablet Take 10 mg by mouth daily.     vitamin B-12 (CYANOCOBALAMIN) 100 MCG tablet Take 100 mcg by mouth daily.     butalbital-acetaminophen-caffeine (FIORICET) 50-325-40 MG tablet Take 1 tablet by mouth every 6 (six) hours as needed for headache. (Patient not taking: Reported on 05/15/2023) 14 tablet 0   gabapentin (NEURONTIN) 100 MG capsule Take 100 mg by mouth at bedtime. (Patient not taking: Reported on 05/15/2023)     No current facility-administered medications on file prior to visit.    Allergies:   Allergies  Allergen Reactions   Lipitor [Atorvastatin] Other (See Comments)    Muscle weakness    Physical Exam General: well developed,  well nourished pleasant elderly Caucasian lady, seated, in no evident distress Head: head normocephalic and atraumatic.   Neck: supple with no carotid or supraclavicular bruits Cardiovascular: regular rate and rhythm, no murmurs Musculoskeletal: no deformity Skin:  no rash/petichiae Vascular:  Normal pulses all extremities  Neurologic Exam Mental Status: Awake and fully alert. Oriented to place and time. Recent and remote memory intact. Attention span, concentration and fund of knowledge appropriate. Mood and affect appropriate.  Diminished recall 1/3.  Able to name 13 animals which can walk on 4 legs.  Clock drawing 4/4. Cranial Nerves: Fundoscopic exam not done pupils equal, briskly reactive to light. Extraocular movements full without nystagmus. Visual fields full to confrontation. Hearing intact. Facial sensation intact. Face, tongue, palate moves normally and symmetrically.  Motor: Normal bulk and tone. Normal strength in all tested extremity muscles. Sensory.:  Patchy diminished touch pinprick sensation in the right hand,  and and foot. Coordination: Rapid alternating movements normal in all  extremities. Finger-to-nose and heel-to-shin performed accurately bilaterally. Gait and Station: Arises from chair without difficulty. Stance is normal. Gait demonstrates normal stride length and balance . Able to heel, toe and tandem walk with only mild difficulty.  Reflexes: 1+ and symmetric. Toes downgoing.   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  Vascular risk factors of hyperlipidemia and hypertension only.     PLAN:I had a long d/w patient and her husband about her left posterior cerebral artery stroke, residual right-sided paresthesias and mild poststroke memory loss, risk for recurrent stroke/TIAs, personally independently reviewed imaging studies and stroke evaluation results and answered questions.Start aspirin 81 mg daily  for secondary stroke prevention and maintain strict control of hypertension with blood pressure goal below 130/90, diabetes with hemoglobin A1c goal below 6.5% and lipids with LDL cholesterol goal below 70 mg/dL. I also advised the patient to eat a healthy diet with plenty of whole grains, cereals, fruits and vegetables, exercise regularly and maintain ideal body weight .check screening follow-up carotid ultrasound study.  Trial of Topamax 25 mg twice daily with meals to help with poststroke paresthesias.  I encouraged her to increase participation in cognitively challenging activities like solving crossword puzzles, playing bridge and sudoku for her memory difficulties.  We also discussed memory compensation strategies..  Followup in the future with my nurse practitioner in 6 months or call earlier if necessary. Greater than 50% time during this 40-minute  visit was spent on counseling and coordination  of care about her cryptogenic stroke , poststroke mild cognitive impairment and residual paresthesias about evaluation and treatment plan and answering questions. Delia Heady, MD Note: This document was prepared with digital dictation and possible smart phrase technology. Any transcriptional errors that result from this process are unintentional.

## 2023-05-15 NOTE — Patient Instructions (Signed)
 I had a long d/w patient and her husband about her left posterior cerebral artery stroke, residual right-sided paresthesias and mild poststroke memory loss, risk for recurrent stroke/TIAs, personally independently reviewed imaging studies and stroke evaluation results and answered questions.Start aspirin 81 mg daily  for secondary stroke prevention and maintain strict control of hypertension with blood pressure goal below 130/90, diabetes with hemoglobin A1c goal below 6.5% and lipids with LDL cholesterol goal below 70 mg/dL. I also advised the patient to eat a healthy diet with plenty of whole grains, cereals, fruits and vegetables, exercise regularly and maintain ideal body weight .check screening follow-up carotid ultrasound study.  Trial of Topamax 25 mg twice daily with meals to help with poststroke paresthesias.  I encouraged her to increase participation in cognitively challenging activities like solving crossword puzzles, playing bridge and sudoku for her memory difficulties.  We also discussed memory compensation strategies..  Followup in the future with my nurse practitioner in 6 months or call earlier if necessary.  Memory Compensation Strategies  Use "WARM" strategy.  W= write it down  A= associate it  R= repeat it  M= make a mental note  2.   You can keep a Glass blower/designer.  Use a 3-ring notebook with sections for the following: calendar, important names and phone numbers,  medications, doctors' names/phone numbers, lists/reminders, and a section to journal what you did  each day.   3.    Use a calendar to write appointments down.  4.    Write yourself a schedule for the day.  This can be placed on the calendar or in a separate section of the Memory Notebook.  Keeping a  regular schedule can help memory.  5.    Use medication organizer with sections for each day or morning/evening pills.  You may need help loading it  6.    Keep a basket, or pegboard by the door.  Place items that  you need to take out with you in the basket or on the pegboard.  You may also want to  include a message board for reminders.  7.    Use sticky notes.  Place sticky notes with reminders in a place where the task is performed.  For example: " turn off the  stove" placed by the stove, "lock the door" placed on the door at eye level, " take your medications" on  the bathroom mirror or by the place where you normally take your medications.  8.    Use alarms/timers.  Use while cooking to remind yourself to check on food or as a reminder to take your medicine, or as a  reminder to make a call, or as a reminder to perform another task, etc.

## 2023-05-16 ENCOUNTER — Telehealth: Payer: Self-pay | Admitting: Adult Health

## 2023-05-16 NOTE — Telephone Encounter (Signed)
 Pt called wanting to discuss some concerns she has taking the topiramate (TOPAMAX) 25 MG tablet . Please advise.

## 2023-05-17 NOTE — Telephone Encounter (Signed)
 Called the pt back. She states in reviewing the side effects she is a little concerned about trying the topiramate and just wanted to discuss before starting.  I reviewed with her the most common side effects that I hear from patients. She states each night she will have a gin and tonic or glass of wine. The instructions state not to consume with alcohol and she is wanting to know if a glass at dinner will cause problems with taking the medication.  I informed her that I did not 100% know the answer to this question but I would ask and if Dr Pearlean Brownie forsees that being an absolute no no, I will call her back and let her know.  Pt will give it a try and see how she does. She will notify us if she has any side effects.

## 2023-05-29 ENCOUNTER — Telehealth: Payer: Self-pay | Admitting: Neurology

## 2023-05-29 NOTE — Telephone Encounter (Signed)
 Pt calling to report how did on the new medication, topiramate (TOPAMAX) 25 MG tablet. Was advised by Dr. Pearlean Brownie at last office to visit to call [facility] report. Pt said the medicaiton is very powerful,  still having symtoms, but symptoms are reduced. Would like a call back before 10:00 am, will not be at home after 10:00 am.

## 2023-06-05 NOTE — Telephone Encounter (Signed)
 Spoke w/Pt regarding symptoms of topiramate. Pt stated she has had bouts of constipation since starting the medication and she has not noticed the "tingling" getting better. Pt stated sh also had unusual episodes of excessive urination Sun (4/6) night as she got up 4 times. Recently Pt also experienced dizziness and HR of 128 (according to Apple watch). Pt is asking if she can stop taking the medication. Informed Pt I will send concerns to Dr. Pearlean Brownie and encouraged Pt to continue the medication until MD responds. Pt stated understanding and was thankful for call back.

## 2023-06-05 NOTE — Telephone Encounter (Signed)
 Pt would like a call back to discuss getting off  topiramate (TOPAMAX) 25 MG tablet. Having some issues with the medication side effect.

## 2023-06-06 NOTE — Telephone Encounter (Signed)
 Spoke w/Pt regarding Dr. Marlis Edelson response that she can stop the topiramate and he recommended she follow up with her PCP regarding the same and the constipation issues. Pt stated understanding and stated she will make [place] aware if the "tingling" worsens. Pt stated thankful for call back.

## 2023-06-09 ENCOUNTER — Ambulatory Visit (HOSPITAL_COMMUNITY)
Admission: RE | Admit: 2023-06-09 | Discharge: 2023-06-09 | Disposition: A | Discharge status: Home / Self Care | Source: Ambulatory Visit | Attending: Neurology | Admitting: Neurology

## 2023-06-09 DIAGNOSIS — I63532 Cerebral infarction due to unspecified occlusion or stenosis of left posterior cerebral artery: Secondary | ICD-10-CM | POA: Diagnosis not present

## 2023-06-09 DIAGNOSIS — I639 Cerebral infarction, unspecified: Secondary | ICD-10-CM | POA: Diagnosis not present

## 2023-06-18 NOTE — Progress Notes (Signed)
 Kindly inform the patient that carotid ultrasound study showed no significant narrowing of either carotid artery in the neck.

## 2023-06-27 DIAGNOSIS — K5900 Constipation, unspecified: Secondary | ICD-10-CM | POA: Diagnosis not present

## 2023-06-27 DIAGNOSIS — D510 Vitamin B12 deficiency anemia due to intrinsic factor deficiency: Secondary | ICD-10-CM | POA: Diagnosis not present

## 2023-07-17 ENCOUNTER — Other Ambulatory Visit: Payer: Self-pay | Admitting: Physician Assistant

## 2023-07-17 DIAGNOSIS — Z1231 Encounter for screening mammogram for malignant neoplasm of breast: Secondary | ICD-10-CM

## 2023-07-20 ENCOUNTER — Other Ambulatory Visit: Payer: Self-pay | Admitting: Physician Assistant

## 2023-07-20 DIAGNOSIS — E538 Deficiency of other specified B group vitamins: Secondary | ICD-10-CM | POA: Diagnosis not present

## 2023-07-20 DIAGNOSIS — Z23 Encounter for immunization: Secondary | ICD-10-CM | POA: Diagnosis not present

## 2023-07-20 DIAGNOSIS — Z1331 Encounter for screening for depression: Secondary | ICD-10-CM | POA: Diagnosis not present

## 2023-07-20 DIAGNOSIS — I77819 Aortic ectasia, unspecified site: Secondary | ICD-10-CM | POA: Diagnosis not present

## 2023-07-20 DIAGNOSIS — R202 Paresthesia of skin: Secondary | ICD-10-CM | POA: Diagnosis not present

## 2023-07-20 DIAGNOSIS — E7800 Pure hypercholesterolemia, unspecified: Secondary | ICD-10-CM | POA: Diagnosis not present

## 2023-07-20 DIAGNOSIS — Z0000 Encounter for general adult medical examination without abnormal findings: Secondary | ICD-10-CM | POA: Diagnosis not present

## 2023-07-20 DIAGNOSIS — Z8673 Personal history of transient ischemic attack (TIA), and cerebral infarction without residual deficits: Secondary | ICD-10-CM | POA: Diagnosis not present

## 2023-07-20 DIAGNOSIS — F325 Major depressive disorder, single episode, in full remission: Secondary | ICD-10-CM | POA: Diagnosis not present

## 2023-07-20 DIAGNOSIS — K219 Gastro-esophageal reflux disease without esophagitis: Secondary | ICD-10-CM | POA: Diagnosis not present

## 2023-07-20 DIAGNOSIS — E039 Hypothyroidism, unspecified: Secondary | ICD-10-CM | POA: Diagnosis not present

## 2023-07-20 DIAGNOSIS — I10 Essential (primary) hypertension: Secondary | ICD-10-CM | POA: Diagnosis not present

## 2023-08-07 ENCOUNTER — Ambulatory Visit
Admission: RE | Admit: 2023-08-07 | Discharge: 2023-08-07 | Disposition: A | Discharge status: Home / Self Care | Source: Ambulatory Visit | Attending: Physician Assistant | Admitting: Physician Assistant

## 2023-08-07 DIAGNOSIS — I77819 Aortic ectasia, unspecified site: Secondary | ICD-10-CM

## 2023-08-07 DIAGNOSIS — I77810 Thoracic aortic ectasia: Secondary | ICD-10-CM | POA: Diagnosis not present

## 2023-08-07 MED ORDER — IOPAMIDOL (ISOVUE-370) INJECTION 76%
75.0000 mL | Freq: Once | INTRAVENOUS | Status: AC | PRN
  Administered 2023-08-07: 75 mL via INTRAVENOUS

## 2023-08-16 DIAGNOSIS — Z8673 Personal history of transient ischemic attack (TIA), and cerebral infarction without residual deficits: Secondary | ICD-10-CM | POA: Diagnosis not present

## 2023-08-16 DIAGNOSIS — K769 Liver disease, unspecified: Secondary | ICD-10-CM | POA: Diagnosis not present

## 2023-08-16 DIAGNOSIS — M791 Myalgia, unspecified site: Secondary | ICD-10-CM | POA: Diagnosis not present

## 2023-08-16 DIAGNOSIS — R202 Paresthesia of skin: Secondary | ICD-10-CM | POA: Diagnosis not present

## 2023-08-16 DIAGNOSIS — Z79899 Other long term (current) drug therapy: Secondary | ICD-10-CM | POA: Diagnosis not present

## 2023-08-21 ENCOUNTER — Ambulatory Visit
Admission: RE | Admit: 2023-08-21 | Discharge: 2023-08-21 | Disposition: A | Discharge status: Home / Self Care | Source: Ambulatory Visit | Attending: Physician Assistant | Admitting: Physician Assistant

## 2023-08-21 DIAGNOSIS — Z1231 Encounter for screening mammogram for malignant neoplasm of breast: Secondary | ICD-10-CM | POA: Diagnosis not present

## 2023-08-23 DIAGNOSIS — K760 Fatty (change of) liver, not elsewhere classified: Secondary | ICD-10-CM | POA: Diagnosis not present

## 2023-08-23 DIAGNOSIS — K769 Liver disease, unspecified: Secondary | ICD-10-CM | POA: Diagnosis not present

## 2023-08-23 DIAGNOSIS — K7689 Other specified diseases of liver: Secondary | ICD-10-CM | POA: Diagnosis not present

## 2023-08-29 ENCOUNTER — Other Ambulatory Visit: Payer: Self-pay | Admitting: Internal Medicine

## 2023-08-29 DIAGNOSIS — K769 Liver disease, unspecified: Secondary | ICD-10-CM

## 2023-09-08 ENCOUNTER — Ambulatory Visit
Admission: RE | Admit: 2023-09-08 | Discharge: 2023-09-08 | Disposition: A | Discharge status: Home / Self Care | Source: Ambulatory Visit | Attending: Internal Medicine | Admitting: Internal Medicine

## 2023-09-08 DIAGNOSIS — D1803 Hemangioma of intra-abdominal structures: Secondary | ICD-10-CM | POA: Diagnosis not present

## 2023-09-08 DIAGNOSIS — K769 Liver disease, unspecified: Secondary | ICD-10-CM

## 2023-09-08 MED ORDER — GADOPICLENOL 0.5 MMOL/ML IV SOLN
7.0000 mL | Freq: Once | INTRAVENOUS | Status: AC | PRN
  Administered 2023-09-08: 7 mL via INTRAVENOUS

## 2023-09-12 DIAGNOSIS — D485 Neoplasm of uncertain behavior of skin: Secondary | ICD-10-CM | POA: Diagnosis not present

## 2023-09-12 DIAGNOSIS — B078 Other viral warts: Secondary | ICD-10-CM | POA: Diagnosis not present

## 2023-09-12 DIAGNOSIS — L820 Inflamed seborrheic keratosis: Secondary | ICD-10-CM | POA: Diagnosis not present

## 2023-09-12 DIAGNOSIS — Z85828 Personal history of other malignant neoplasm of skin: Secondary | ICD-10-CM | POA: Diagnosis not present

## 2023-10-26 DIAGNOSIS — R52 Pain, unspecified: Secondary | ICD-10-CM | POA: Diagnosis not present

## 2023-10-26 DIAGNOSIS — I639 Cerebral infarction, unspecified: Secondary | ICD-10-CM | POA: Diagnosis not present

## 2023-11-06 DIAGNOSIS — M2550 Pain in unspecified joint: Secondary | ICD-10-CM | POA: Diagnosis not present

## 2023-11-06 DIAGNOSIS — R52 Pain, unspecified: Secondary | ICD-10-CM | POA: Diagnosis not present

## 2023-11-07 ENCOUNTER — Telehealth: Payer: Self-pay | Admitting: Adult Health

## 2023-11-07 ENCOUNTER — Ambulatory Visit: Admitting: Adult Health

## 2023-11-07 NOTE — Telephone Encounter (Signed)
 Pt was due to see harlene 9/9, harlene out though. Pt leavign for [place] this Thursday and has been having some issued and would llike to  speak with a nurse. Pt r/s for October 8.

## 2023-11-07 NOTE — Telephone Encounter (Signed)
 Attempted to call Pt, not available. LM to call office.

## 2023-11-28 DIAGNOSIS — Z23 Encounter for immunization: Secondary | ICD-10-CM | POA: Diagnosis not present

## 2023-11-29 DIAGNOSIS — M2550 Pain in unspecified joint: Secondary | ICD-10-CM | POA: Diagnosis not present

## 2023-12-06 ENCOUNTER — Encounter: Payer: Self-pay | Admitting: Adult Health

## 2023-12-06 ENCOUNTER — Ambulatory Visit (INDEPENDENT_AMBULATORY_CARE_PROVIDER_SITE_OTHER): Admitting: Adult Health

## 2023-12-06 VITALS — BP 139/86 | HR 76 | Ht 65.0 in | Wt 146.0 lb

## 2023-12-06 DIAGNOSIS — G8929 Other chronic pain: Secondary | ICD-10-CM | POA: Diagnosis not present

## 2023-12-06 DIAGNOSIS — M25511 Pain in right shoulder: Secondary | ICD-10-CM | POA: Diagnosis not present

## 2023-12-06 DIAGNOSIS — M2550 Pain in unspecified joint: Secondary | ICD-10-CM

## 2023-12-06 DIAGNOSIS — I63532 Cerebral infarction due to unspecified occlusion or stenosis of left posterior cerebral artery: Secondary | ICD-10-CM | POA: Diagnosis not present

## 2023-12-06 DIAGNOSIS — R202 Paresthesia of skin: Secondary | ICD-10-CM

## 2023-12-06 NOTE — Progress Notes (Signed)
 [facility] [address] [place]. [place] [address] [phone]       OFFICE FOLLOW-UP VISIT NOTE  Veronica Webb Date of Birth:  1941/01/12 Medical Record Number:  [id number]   Primary neurologist: Dr. Rosemarie Reason for visit: Stroke follow-up  Chief Complaint  Patient presents with   Cerebrovascular Accident    RM 8 with spouse Pt is well, reports she is having ongoing R sided tingling and heaviness. No other concerns.       HPI:  Update 12/06/2023 JM: Patient returns for follow-up visit accompanied by her spouse after prior visit with Dr. Rosemarie 7 months ago where she was started on topiramate  for poststroke paresthesias.  Topiramate  discontinued shortly after due to side effects including constipation, dizziness, and tachycardia.  She denied any benefit on topiramate  and continues to have right sided numbness and heaviness sensation.  Previously tried gabapentin but unable to tolerate. Denies painful sensation, more just intermittent numbness/tingling that can be bothersome.  She also reports right shoulder pain and decreased range of motion since her stroke.  Occasionally feels heaviness sensation on her right side.  She has been doing water aerobics which she does feel helps overall.  No new stroke/TIA symptoms.  She also mentions sudden onset of bilateral shoulder, hip and knee pain about 1 months ago.  Reports difficulty standing from seated position due to significant left knee pain.  Lab work completed by PCP which was overall benign.  Did do a short course of prednisone with some benefit.  Has been taking Tylenol  CoQ 10 which has been beneficial.  She does note restarting Crestor mid to end of August and was advised by PCP to discontinue about 2 weeks ago.  Symptoms have been gradually improving but she is unsure if this is due to Tylenol  use. Reports PCP has referred her to see ortho for knee pain but has not yet scheduled.   Reports compliance on aspirin   without side effects.  Prior lipid panel with PCP 06/2023 LDL 74.  Routinely follows with PCP for stroke risk factor management, has f/u visit next month.  Carotid ultrasound completed which showed bilateral 1 to 39% stenosis.      History provided for reference purposes only Update 05/15/2023 Dr. Rosemarie: She returns for follow-up after last visit 7 months ago.  She is accompanied by her husband.  She continues to have significant paresthesias in the right side.  Mostly in the right leg but at times they can go up involving the right rib cage as well as the hand.  These appear intermittently but occur daily.  They are not constant.  She tried taking gabapentin 100 mg but started having nightmares and hence stopped it.  She complains of mild short-term memory difficulties but these are not progressive or bothersome.  She did undergo a lipid profile check on 11/22/2022 and LDL cholesterol was optimal at 66 mg percent.  I had referred the patient to electrophysiology for loop recorder implant but patient after discussion with them decided to get a 30-day heart monitor which was done from February 06, 2023 to March 24, 2023 and was negative for paroxysmal A-fib.  She has had no recurrent stroke or TIA symptoms.  She continues on aspirin  which is tolerating well with minor bruising and no bleeding.  She is tolerating Crestor well without muscle aches and pains.  She states her blood pressure under good control.  Initial visit 10/18/2022 Dr. Rosemarie: Veronica Webb is a pleasant 83 year old Caucasian  lady seen today for initial office consultation visit for stroke.  She is accompanied by her husband.  History is obtained from them and review of electronic medical records.  I personally reviewed pertinent available imaging films in PACS.  She has past medical history of hypertension and hypothyroidism.  She presented on 09/21/2022 for evaluation for sudden onset of right-sided numbness and weakness.  She was at a grocery store  while walking out she noticed the right face arm and leg suddenly became numb this lasted about 10 minutes and started getting better.  This happened multiple times and was transient.  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  Patient post TNK developed severe headache and stat repeat CT head showed parenchymal hemorrhage in the left occipital lobe and likely in the region of the infarcted tissue.  TNK was reversed with cryoprecipitate.  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  Patient was admitted to the intensive care unit and blood pressure is tightly controlled.  She remained neurologically stable.  MRI scan showed stable appearance of the parenchymal hemorrhage in the left occipital lobe with some associated subarachnoid and subdural blood.  There was small infarct noted in the left thalamocapsular region and left cerebral cortex in the PCA distribution.  Echocardiogram showed ejection fraction of 55 to 60%.  LDL cholesterol elevated 119 mg percent and hemoglobin A1c was 5.2.  She was discharged not on any antiplatelet agent because of the hemorrhage.  Patient stated she has done well since discharge but she has persistent paresthesias on the right side.  She has some intermittent numbness in the right foot, right rib cage and hand this occurs about once or twice a day but it is troublesome but not quite bothering her.  She is noticed some increasing reading difficulties since her stroke but has not yet seen an ophthalmologist or optometrist for the same.  She does have some mild short-term memory difficulties but these even were present before the stroke and have not gotten worse.  Patient was started on Lipitor which she did not tolerate and hence was switched to Crestor which she seems to be tolerating well so far but has not yet had a follow-up lipid profile  checked.  She has discontinued Estrace which she was taking at the time of the stroke and has not had any withdrawal symptoms and does not want to go back on it.  She has a colonoscopy scheduled but is willing to postpone it if she has to start taking aspirin .  She denies any peripheral vision loss gait or balance problems or recurrent stroke TIA symptoms.  She has no history of palpitations or passing out though her sister was diagnosed with A-fib a few years ago     ROS:   14 system review of systems is positive for those listed in HPI and all other systems negative  PMH:  Past Medical History:  Diagnosis Date   Hypothyroidism     Social History:  Social History   Socioeconomic History   Marital status: Married    Spouse name: Not on file   Number of children: Not on file   Years of education: Not on file   Highest education level: Not on file  Occupational History   Not on file  Tobacco Use   Smoking status: Never   Smokeless tobacco: Never  Vaping Use  Vaping status: Never Used  Substance and Sexual Activity   Alcohol use: Yes    Comment: 2 glasses of wine per day   Drug use: No   Sexual activity: Not on file  Other Topics Concern   Not on file  Social History Narrative   Not on file   Social Drivers of Health   Financial Resource Strain: Not on file  Food Insecurity: Not on file  Transportation Needs: Not on file  Physical Activity: Not on file  Stress: Not on file  Social Connections: Not on file  Intimate Partner Violence: Not on file    Medications:   Current Outpatient Medications on File Prior to Visit  Medication Sig Dispense Refill   amLODipine (NORVASC) 2.5 MG tablet Take 2.5 mg by mouth daily.     aspirin  EC 81 MG tablet Take 1 tablet (81 mg total) by mouth daily. Swallow whole. 90 each 12   BIOTIN FORTE PO Take by mouth.     Cholecalciferol (VITAMIN D) 2000 UNITS tablet Take 2,000 Units by mouth daily.     levothyroxine (SYNTHROID, LEVOTHROID)  25 MCG tablet Take 25 mcg by mouth daily before breakfast.     pantoprazole  (PROTONIX ) 40 MG tablet Take 40 mg by mouth daily.     vitamin B-12 (CYANOCOBALAMIN ) 100 MCG tablet Take 100 mcg by mouth daily.     No current facility-administered medications on file prior to visit.    Allergies:   Allergies  Allergen Reactions   Lipitor [Atorvastatin] Other (See Comments)    Muscle weakness    Physical Exam Today's Vitals   12/06/23 0838  BP: 139/86  Pulse: 76  Weight: 146 lb (66.2 kg)  Height: 5' 5 (1.651 m)   Body mass index is 24.3 kg/m.   General: well developed, well nourished very pleasant elderly Caucasian lady, seated, in no evident distress  Neurologic Exam Mental Status: Awake and fully alert. Oriented to place and time. Recent and remote memory intact. Attention span, concentration and fund of knowledge appropriate during visit. Mood and affect appropriate.  Cranial Nerves: pupils equal, briskly reactive to light. Extraocular movements full without nystagmus. Visual fields full to confrontation. Hearing intact. Facial sensation intact. Face, tongue, palate moves normally and symmetrically.  Motor: Normal bulk and tone. Normal strength in all tested extremity muscles except slightly decreased right hand dexterity.  Decreased right shoulder ROM.  Sensory.:  Intact to light touch throughout all tested extremities although subjectively, numb sensation Coordination: Rapid alternating movements normal in all extremities except slightly decreased right hand. Finger-to-nose and heel-to-shin performed accurately bilaterally. Gait and Station: Arises from chair without difficulty. Stance is normal. Gait demonstrates normal stride length and balance without use of AD. Able to heel, toe and tandem walk with only mild difficulty.  Reflexes: 1+ and symmetric. Toes downgoing.        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  Vascular risk factors of hyperlipidemia and hypertension only.  Cardiac monitor negative for A-fib.  Carotid ultrasound 05/2023 without significant narrowing bilaterally. Reports acute onset of multiple joint pain about 1 month ago.      PLAN:  L PCA stroke -Residual right sided intermittent paresthesias and right shoulder stiffness - Referral placed to PT for possible improvement of paresthesias in right shoulder stiffness -currently denies painful symptoms. Advised to call if this should occur - Continue aspirin  81 mg daily  for secondary stroke prevention managed/prescribed by PCP -has repeat labs with PCP next month, advised if LDL >70, consider start of Zetia or PCSK9 inhibitor but will defer to PCP.  Intolerance to Lipitor and Crestor - Continue close PCP follow-up for aggressive stroke risk factor management  2.  Multiple joint pain, acute onset -suspicion statin related as symptoms started shortly after restarting Crestor. This has since been discontinued. Advised this is unlikely to be from her stroke which patient was questioning. -advised if symptoms persist over the next couple of weeks, f/u with PCP to discuss rheumatology and/or Ortho evaluation    No further recommendations from neurological standpoint and is closely being followed by PCP.  She can follow-up here on an as-needed basis and advised to call with any questions or concerns in the future     I personally spent a total of 45 minutes in the care of the patient today including preparing to see the patient, getting/reviewing separately obtained history, performing a medically appropriate exam/evaluation, counseling and educating, placing orders, and documenting clinical information in the EHR. This is our first time meeting and time has been spent reviewing past medical history and relevant medical records.   Harlene Bogaert, AGNP-BC  [facility] [address] [place], [place] [address]  Phone [phone] Fax [phone] Note: This document was prepared with digital dictation and possible smart phrase technology. Any transcriptional errors that result from this process are unintentional.

## 2023-12-06 NOTE — Patient Instructions (Signed)
 Order placed to start physical therapy to hopeful benefit of right sided numbness sensation and right shoulder pain   Suspect sudden onset of joint pain possibly due to Crestor - if symptoms persist over the next couple of weeks, please reach back out to your primary doctor - may need further evaluation with orthopedics or rheumatology   Continue aspirin  81 mg daily for secondary stroke prevention - repeat cholesterol levels next month with PCP. If LDL or bad cholesterol above 70, would recommend trial of Zetia or consider injectables such as Repatha or Praulent   Continue to follow up with PCP regarding blood pressure and cholesterol management  Maintain strict control of hypertension with blood pressure goal below 130/90 and cholesterol with LDL cholesterol (bad cholesterol) goal below 70 mg/dL.   Signs of a Stroke? Follow the BEFAST method:  Balance Watch for a sudden loss of balance, trouble with coordination or vertigo Eyes Is there a sudden loss of vision in one or both eyes? Or double vision?  Face: Ask the person to smile. Does one side of the face droop or is it numb?  Arms: Ask the person to raise both arms. Does one arm drift downward? Is there weakness or numbness of a leg? Speech: Ask the person to repeat a simple phrase. Does the speech sound slurred/strange? Is the person confused ? Time: If you observe any of these signs, call 911.       Thank you for coming to see us  at [facility]. I hope we have been able to provide you high quality care today.  You may receive a patient satisfaction survey over the next few weeks. We would appreciate your feedback and comments so that we may continue to improve ourselves and the health of our patients.

## 2023-12-20 NOTE — Therapy (Signed)
 OUTPATIENT PHYSICAL THERAPY NEURO EVALUATION   Patient Name: Veronica Webb MRN: [id number] DOB:1941-01-19, 83 y.o., female Today's Date: 12/21/2023   PCP: Dwight Trula SQUIBB, MD  REFERRING PROVIDER:    Whitfield Raisin, NP    END OF SESSION:  PT End of Session - 12/21/23 0934     Visit Number 1    Number of Visits 13    Date for Recertification  02/15/24    Authorization Type Medicare/BCBS    PT Start Time 0808    PT Stop Time 0845    PT Time Calculation (min) 37 min    Activity Tolerance Patient tolerated treatment well    Behavior During Therapy [facility] for tasks assessed/performed          Past Medical History:  Diagnosis Date   Hypothyroidism    Past Surgical History:  Procedure Laterality Date   ABDOMINAL HYSTERECTOMY  1985   ovaries removed   COLONOSCOPY     COLONOSCOPY WITH PROPOFOL  N/A 01/29/2013   Procedure: COLONOSCOPY WITH PROPOFOL ;  Surgeon: Gladis MARLA Louder, MD;  Location: [facility] ENDOSCOPY;  Service: Endoscopy;  Laterality: N/A;   EYE SURGERY     cataract   Patient Active Problem List   Diagnosis Date Noted   Acute ischemic stroke (HCC) 09/21/2022    ONSET DATE: CVA  09/21/2022  REFERRING DIAG: P36.467 (ICD-10-CM) - 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 Have tingling on the R side of her body and tightness over the anterior shoulder. This has progressed to N/T in B  3rd lateral fingers and persistent but fluctuating pain in multiple joints. She suspects that Lipitor was contributing to these paisn so she discontinued it. Feels weaker on the R side of her body. More careful due to N/T in the bottom of her feet. Taking Tylenol  for her pain. Was previously doing water aerobics but is having a hard time doing this now, walking less now as well because of fatigue. Reports some issues with hand dexterity and hand writing on the R side.   Pt accompanied by: self  PERTINENT  HISTORY: HLD, HTN  PAIN:  Are you having pain? Not currently  PRECAUTIONS: None  RED FLAGS: None   WEIGHT BEARING RESTRICTIONS: No  FALLS: Has patient fallen in last 6 months? No  LIVING ENVIRONMENT: Lives with: lives with their spouse Lives in: House/apartment [facility] Stairs: none Has following equipment at home: Shower bench, Grab bars, and walking stick  PLOF: Independent and Leisure: was previously doing water aerobics but is having a hard time doing this now, walking   PATIENT GOALS: I would like to eliminate the pain   OBJECTIVE:  Note: Objective measures were completed at Evaluation unless otherwise noted.  DIAGNOSTIC FINDINGS: none recent  COGNITION: Overall cognitive status: Within functional limits for tasks assessed   SENSATION: WNL to light tough in UEs and LEs   COORDINATION: Alternating pronation/supination: WNL B Alternating toe tap: WNL B Finger to nose: slight dysmetria R   MUSCLE TONE: WNL B LEs  POSTURE: rounded shoulders, flexed trunk  Palpation: c/o TTP and slightly tight in the R proximal biceps tendon   LOWER EXTREMITY ROM:     Active  Right Eval Left Eval  Hip flexion    Hip extension    Hip abduction    Hip adduction    Hip internal rotation    Hip external rotation    Knee flexion    Knee extension 12 14  Ankle dorsiflexion    Ankle plantarflexion    Ankle inversion    Ankle eversion     (Blank rows =  not tested)  LOWER EXTREMITY MMT:    MMT Right Eval Left Eval  Hip flexion 3 3  Hip extension    Hip abduction 4 4  Hip adduction 4+ 4+  Hip internal rotation    Hip external rotation    Knee flexion 3+ 3+  Knee extension 4+ 4+  Ankle dorsiflexion 4+ 4+  Ankle plantarflexion Unable to perform without compensations in standing Unable to perform without compensations in standing  Ankle inversion    Ankle eversion    (Blank rows = not tested)  GAIT: Findings: Assistive device utilized:None, Level of assistance: Complete Independence, and Comments: slight L hip drop, slight trunk flexion    FUNCTIONAL TESTS:  5 times sit to stand: 13.29 sec with cues to stand up fully, limited eccentric control and c/o L knee pain  10 meter walk test: 8.16 sec (4.02 ft/sec)                                                                                                 TREATMENT DATE: 12/21/23    PATIENT EDUCATION: Education details: prognosis, POC, exam findings as they relate to patient's impairments, edu on benefits of OT for writing, hand dexterity, shoulder pain- pt agreeable Person educated: Patient Education method: Explanation, Demonstration, Tactile cues, Verbal cues, and Handouts Education comprehension: verbalized understanding and returned demonstration  HOME EXERCISE PROGRAM: Not yet initiated  GOALS: Goals reviewed with patient? Yes  SHORT TERM GOALS: Target date: 01/11/2024  Patient to be independent with initial HEP. Baseline: HEP initiated Goal status: INITIAL  LONG TERM GOALS: Target date: 02/15/2024  Patient to be independent with advanced HEP. Baseline: Not yet initiated  Goal status: INITIAL  Patient to demonstrate B LE strength >/=4+/5.  Baseline: See above Goal status: INITIAL  goal to be created. Baseline: - Goal status: INITIAL  Patient to demonstrate STS 10x within full ROM and good eccentric control.  Baseline: not standing fully and  limited eccentric control Goal status: INITIAL  Patient to report return to water aerobics without fatigue limiting.  Baseline: notes fatigue Goal status: INITIAL   ASSESSMENT:  CLINICAL IMPRESSION:  Patient is an 83 y/o F presenting to OPPT with c/o R>L hemibody N/T s/p CVA in 2024 as well as increased fatigue and multi joint pain. She also notes R shoulder pain and issues with hand dexterity which she will benefit from OT for. Patient notes more difficulty participating in fitness activities like water aerobics and walking. Patient today presenting with Slight R UE dysmetria, flexed/rounded posture, TTP and tightness over R proximal biceps tendon, marked LE weakness, gait deviations, and difficulty performing transfers. Would benefit from skilled PT services 1-2 x/week for 6 weeks to address aforementioned impairments in order to optimize level of function.    OBJECTIVE IMPAIRMENTS: Abnormal gait, decreased activity tolerance, decreased coordination, decreased endurance, difficulty walking, decreased strength, postural dysfunction, and pain.   ACTIVITY LIMITATIONS: carrying, lifting, bending, standing, squatting, stairs, transfers, and locomotion level  PARTICIPATION LIMITATIONS: meal prep, cleaning, laundry, shopping, community activity, occupation, yard work, and church  PERSONAL FACTORS: Age, Past/current experiences, Time since onset of injury/illness/exacerbation, and 1-2 comorbidities: HTN, HLD are also affecting patient's functional outcome.   REHAB POTENTIAL: Good  CLINICAL DECISION MAKING: Evolving/moderate complexity  EVALUATION COMPLEXITY: Moderate  PLAN:  PT FREQUENCY: 1-2x/week  PT DURATION: 6 weeks  PLANNED INTERVENTIONS: 97164- PT Re-evaluation, 97750- Physical Performance Testing, 97110-Therapeutic exercises, 97530- Therapeutic activity, 97112- Neuromuscular re-education, 97535- Self Care, 02859- Manual therapy, [phone]- Gait training, [phone]- Canalith repositioning,  [id number]- Aquatic Therapy, [phone]- Electrical stimulation (manual), [phone] (1-2 muscles), 20561 (3+ muscles)- Dry Needling, Patient/Family education, Balance training, Stair training, Taping, Joint mobilization, Spinal mobilization, Vestibular training, DME instructions, Cryotherapy, and Moist heat  PLAN FOR NEXT SESSION: and create goal; initiate HEP for LE strengthening and endurance; consider interval training to improve activity tolerance    Louana Terrilyn Christians, PT, DPT 12/21/23 9:46 AM  [facility] at [facility] [address] [place], [place] [address] Phone # [phone] Fax # [phone]

## 2023-12-21 ENCOUNTER — Ambulatory Visit: Attending: Adult Health | Admitting: Physical Therapy

## 2023-12-21 ENCOUNTER — Telehealth: Payer: Self-pay | Admitting: Physical Therapy

## 2023-12-21 ENCOUNTER — Encounter: Payer: Self-pay | Admitting: Physical Therapy

## 2023-12-21 ENCOUNTER — Other Ambulatory Visit: Payer: Self-pay

## 2023-12-21 DIAGNOSIS — R29818 Other symptoms and signs involving the nervous system: Secondary | ICD-10-CM | POA: Insufficient documentation

## 2023-12-21 DIAGNOSIS — G8929 Other chronic pain: Secondary | ICD-10-CM | POA: Diagnosis not present

## 2023-12-21 DIAGNOSIS — M25511 Pain in right shoulder: Secondary | ICD-10-CM | POA: Insufficient documentation

## 2023-12-21 DIAGNOSIS — M6281 Muscle weakness (generalized): Secondary | ICD-10-CM | POA: Insufficient documentation

## 2023-12-21 DIAGNOSIS — R2689 Other abnormalities of gait and mobility: Secondary | ICD-10-CM | POA: Insufficient documentation

## 2023-12-21 DIAGNOSIS — I63532 Cerebral infarction due to unspecified occlusion or stenosis of left posterior cerebral artery: Secondary | ICD-10-CM | POA: Insufficient documentation

## 2023-12-21 DIAGNOSIS — R202 Paresthesia of skin: Secondary | ICD-10-CM | POA: Diagnosis not present

## 2023-12-21 NOTE — Telephone Encounter (Signed)
Order placed as requested. Please let me know if you need anything else!  

## 2023-12-21 NOTE — Telephone Encounter (Signed)
 Hi Veronica Webb,  Veronica Webb was evaluated in OPPT s/p CVA per your referral.  The patient would benefit from OT evaluation for R shoulder pain and impaired hand dexterity.   If you agree, please place an order in [facility] in [facility] or fax the order to [phone].  Thank you,  Louana Terrilyn Christians, PT, DPT 12/21/23 9:48 AM  [facility] at [facility] [address] [place], [address] [place], [place] [address] Phone # [phone] Fax # [phone]

## 2023-12-21 NOTE — Addendum Note (Signed)
 Addended by: WHITFIELD RAISIN L on: 12/21/2023 09:59 AM   Modules accepted: Orders

## 2023-12-26 ENCOUNTER — Ambulatory Visit

## 2023-12-29 NOTE — Therapy (Signed)
 OUTPATIENT PHYSICAL THERAPY NEURO TREATMENT   Patient Name: Veronica Webb MRN: [id number] DOB:02-Dec-1940, 83 y.o., female Today's Date: 01/01/2024   PCP: Dwight Trula SQUIBB, MD  REFERRING PROVIDER:    Whitfield Raisin, NP    END OF SESSION:  PT End of Session - 01/01/24 1100     Visit Number 2    Number of Visits 13    Date for Recertification  02/15/24    Authorization Type Medicare/BCBS    PT Start Time 1020    PT Stop Time 1102    PT Time Calculation (min) 42 min    Equipment Utilized During Treatment Gait belt    Activity Tolerance Patient tolerated treatment well    Behavior During Therapy WFL for tasks assessed/performed           Past Medical History:  Diagnosis Date   Hypothyroidism    Past Surgical History:  Procedure Laterality Date   ABDOMINAL HYSTERECTOMY  1985   ovaries removed   COLONOSCOPY     COLONOSCOPY WITH PROPOFOL  N/A 01/29/2013   Procedure: COLONOSCOPY WITH PROPOFOL ;  Surgeon: Gladis MARLA Louder, MD;  Location: [facility];  Service: Endoscopy;  Laterality: N/A;   EYE SURGERY     cataract   Patient Active Problem List   Diagnosis Date Noted   Acute ischemic stroke (HCC) 09/21/2022    ONSET DATE: CVA  09/21/2022  REFERRING DIAG: P36.467 (ICD-10-CM) - 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 Tylenol  seems to be masking the pain.   Pt accompanied by:  self  PERTINENT HISTORY: HLD, HTN  PAIN:  Are you having pain? No  PRECAUTIONS: None  RED FLAGS: None   WEIGHT BEARING RESTRICTIONS: No  FALLS: Has patient fallen in last 6 months? No  LIVING ENVIRONMENT: Lives with: lives with their spouse Lives in: House/apartment [facility] Stairs: none Has following equipment at home: Shower bench, Grab bars, and walking stick  PLOF: Independent and Leisure: was previously doing water aerobics but is having a hard time doing this now, walking   PATIENT GOALS: I would like  to eliminate the pain   OBJECTIVE:     TODAY'S TREATMENT: 01/01/24 Activity Comments  and create goal Pre:129/72 mmHg, 95%, 71 bpm  6/20 RPE  Post: 145/84 mmHg, 94% spO2,  12/20 RPE   Pt ambulated 1654 ft (504 M) Pt reports increased tingling in R UE and LE after completing test   Green medball STS 30 standing march 3# 30 2 rounds, as many reps as possible   Use of chair for stability. Modified STS with airex under bottom d/t knee pain. Short standing break after 2 rounds.   sidestepping 3# 60 standing hip ex 3# 30 each LE 2 rounds, as many reps as possible  Cues for proper form/positioning and pacing. Use of counter for support.          HOME EXERCISE PROGRAM Last updated: 01/01/24 Access Code: [email or web address] URL: [email or web address] Date: 01/01/2024 Prepared by: [facility] - Outpatient  Rehab - [facility]  Exercises - Sit to Stand with Arms Crossed  - 1 x daily - 5 x weekly - 2 sets - 30 sec hold - Standing March with Counter Support  - 1 x daily - 5 x weekly - 2 sets - 30 sec hold - Standing Hip Extension with Counter Support  - 1 x daily - 5 x weekly - 2 sets - 30 sec hold - Side Stepping with Counter Support  - 1 x daily - 5 x weekly - 2 sets - 60 sec hold   PATIENT EDUCATION: Education details: edu on results of , HEP and edu on HIIT  Person educated: Patient Education method: Explanation,  Demonstration, Tactile cues, Verbal cues, and Handouts Education comprehension: verbalized understanding and returned demonstration    Note: Objective measures were completed at Evaluation unless otherwise noted.  DIAGNOSTIC FINDINGS: none recent  COGNITION: Overall cognitive status: Within functional limits for tasks assessed   SENSATION: WNL to light tough in UEs and LEs   COORDINATION: Alternating pronation/supination: WNL B Alternating toe tap: WNL B Finger to nose: slight dysmetria R   MUSCLE TONE: WNL B LEs  POSTURE: rounded shoulders, flexed trunk  Palpation: c/o TTP and slightly tight in the R proximal biceps tendon   LOWER EXTREMITY ROM:     Active  Right Eval Left Eval  Hip flexion    Hip extension    Hip abduction    Hip adduction    Hip internal rotation    Hip external rotation    Knee flexion    Knee extension 12 14  Ankle dorsiflexion    Ankle plantarflexion    Ankle inversion    Ankle eversion     (Blank rows = not tested)  LOWER EXTREMITY MMT:    MMT Right Eval Left Eval  Hip flexion 3 3  Hip extension    Hip abduction 4 4  Hip adduction 4+ 4+  Hip internal rotation    Hip external rotation    Knee flexion 3+ 3+  Knee extension 4+ 4+  Ankle dorsiflexion 4+ 4+  Ankle plantarflexion Unable to perform without compensations in standing Unable to perform without compensations in standing  Ankle inversion    Ankle eversion    (Blank rows = not tested)  GAIT: Findings: Assistive device utilized:None, Level of assistance: Complete Independence, and Comments: slight L hip drop, slight trunk flexion    FUNCTIONAL TESTS:  5 times sit to stand: 13.29 sec with cues to stand up fully, limited eccentric control and c/o L knee pain  10  meter walk test: 8.16 sec (4.02 ft/sec)                                                                                                 TREATMENT DATE: 12/21/23    PATIENT EDUCATION: Education details:  prognosis, POC, exam findings as they relate to patient's impairments, edu on benefits of OT for writing, hand dexterity, shoulder pain- pt agreeable Person educated: Patient Education method: Explanation, Demonstration, Tactile cues, Verbal cues, and Handouts Education comprehension: verbalized understanding and returned demonstration  HOME EXERCISE PROGRAM: Not yet initiated  GOALS: Goals reviewed with patient? Yes  SHORT TERM GOALS: Target date: 01/11/2024  Patient to be independent with initial HEP. Baseline: HEP initiated 01/01/24 Goal status: IN PROGRESS    LONG TERM GOALS: Target date: 02/15/2024  Patient to be independent with advanced HEP. Baseline: Not yet initiated  Goal status: IN PROGRESS  Patient to demonstrate B LE strength >/=4+/5.  Baseline: See above Goal status: IN PROGRESS  Patient to ambulate 550 M during . Baseline: 504 M 01/01/24 Goal status: IN PROGRESS 01/01/24  Patient to demonstrate STS 10x within full ROM and good eccentric control.  Baseline: not standing fully and limited eccentric control Goal status: IN PROGRESS  Patient to report return to water aerobics without fatigue limiting.  Baseline: notes fatigue Goal status: IN PROGRESS   ASSESSMENT:  CLINICAL IMPRESSION: Patient arrived to session with report of improved pain. Patient ambulated 504 ft during , surpassing age-matched norms. However, as pt does not feet back to baseline, will continue working on stamina. Session focused on initiation of HEP using interval training to increase activity tolerance. modifications provided for comfort. Patient tolerated session well and without complaints at end of appointment.   OBJECTIVE IMPAIRMENTS: Abnormal gait, decreased activity tolerance, decreased coordination, decreased endurance, difficulty walking, decreased strength, postural dysfunction, and pain.   ACTIVITY LIMITATIONS: carrying, lifting, bending, standing, squatting, stairs,  transfers, and locomotion level  PARTICIPATION LIMITATIONS: meal prep, cleaning, laundry, shopping, community activity, occupation, yard work, and church  PERSONAL FACTORS: Age, Past/current experiences, Time since onset of injury/illness/exacerbation, and 1-2 comorbidities: HTN, HLD are also affecting patient's functional outcome.   REHAB POTENTIAL: Good  CLINICAL DECISION MAKING: Evolving/moderate complexity  EVALUATION COMPLEXITY: Moderate  PLAN:  PT FREQUENCY: 1-2x/week  PT DURATION: 6 weeks  PLANNED INTERVENTIONS: 97164- PT Re-evaluation, 97750- Physical Performance Testing, 97110-Therapeutic exercises, 97530- Therapeutic activity, [id number]- Neuromuscular re-education, 97535- Self Care, 02859- Manual therapy, [phone]- Gait training, [phone]- Canalith repositioning, [id number]- Aquatic Therapy, [phone]- Electrical stimulation (manual), [phone] (1-2 muscles), 20561 (3+ muscles)- Dry Needling, Patient/Family education, Balance training, Stair training, Taping, Joint mobilization, Spinal mobilization, Vestibular training, DME instructions, Cryotherapy, and Moist heat  PLAN FOR NEXT SESSION: and create goal; initiate HEP for LE strengthening and endurance; consider interval training to improve activity tolerance    Louana Terrilyn Christians, PT, DPT 01/01/24 11:03 AM  [facility] [address] [place], [place] [address] Phone # [phone] Fax # [phone]

## 2024-01-01 ENCOUNTER — Ambulatory Visit: Attending: Adult Health | Admitting: Physical Therapy

## 2024-01-01 ENCOUNTER — Encounter: Payer: Self-pay | Admitting: Physical Therapy

## 2024-01-01 DIAGNOSIS — M25511 Pain in right shoulder: Secondary | ICD-10-CM | POA: Diagnosis present

## 2024-01-01 DIAGNOSIS — G8929 Other chronic pain: Secondary | ICD-10-CM | POA: Diagnosis present

## 2024-01-01 DIAGNOSIS — R29818 Other symptoms and signs involving the nervous system: Secondary | ICD-10-CM | POA: Insufficient documentation

## 2024-01-01 DIAGNOSIS — M6281 Muscle weakness (generalized): Secondary | ICD-10-CM | POA: Diagnosis present

## 2024-01-01 DIAGNOSIS — R208 Other disturbances of skin sensation: Secondary | ICD-10-CM | POA: Insufficient documentation

## 2024-01-01 DIAGNOSIS — R278 Other lack of coordination: Secondary | ICD-10-CM | POA: Diagnosis present

## 2024-01-01 DIAGNOSIS — R2689 Other abnormalities of gait and mobility: Secondary | ICD-10-CM | POA: Diagnosis present

## 2024-01-03 ENCOUNTER — Encounter: Payer: Self-pay | Admitting: Physical Therapy

## 2024-01-03 ENCOUNTER — Ambulatory Visit: Admitting: Physical Therapy

## 2024-01-03 DIAGNOSIS — R2689 Other abnormalities of gait and mobility: Secondary | ICD-10-CM | POA: Diagnosis not present

## 2024-01-03 DIAGNOSIS — G8929 Other chronic pain: Secondary | ICD-10-CM | POA: Diagnosis not present

## 2024-01-03 DIAGNOSIS — R29818 Other symptoms and signs involving the nervous system: Secondary | ICD-10-CM | POA: Diagnosis not present

## 2024-01-03 DIAGNOSIS — M25511 Pain in right shoulder: Secondary | ICD-10-CM | POA: Diagnosis not present

## 2024-01-03 DIAGNOSIS — M6281 Muscle weakness (generalized): Secondary | ICD-10-CM

## 2024-01-03 DIAGNOSIS — R278 Other lack of coordination: Secondary | ICD-10-CM | POA: Diagnosis not present

## 2024-01-03 NOTE — Therapy (Signed)
 OUTPATIENT PHYSICAL THERAPY NEURO TREATMENT   Patient Name: Veronica Webb MRN: [id number] DOB:17-Jul-1940, 83 y.o., female Today's Date: 01/03/2024   PCP: Dwight Trula SQUIBB, MD  REFERRING PROVIDER:    Whitfield Raisin, NP    END OF SESSION:  PT End of Session - 01/03/24 0804     Visit Number 3    Number of Visits 13    Date for Recertification  02/15/24    Authorization Type Medicare/BCBS    PT Start Time 0804    PT Stop Time 0844    PT Time Calculation (min) 40 min    Equipment Utilized During Treatment --    Activity Tolerance Patient tolerated treatment well    Behavior During Therapy [facility] for tasks assessed/performed            Past Medical History:  Diagnosis Date   Hypothyroidism    Past Surgical History:  Procedure Laterality Date   ABDOMINAL HYSTERECTOMY  1985   ovaries removed   COLONOSCOPY     COLONOSCOPY WITH PROPOFOL  N/A 01/29/2013   Procedure: COLONOSCOPY WITH PROPOFOL ;  Surgeon: Gladis MARLA Louder, MD;  Location: [facility];  Service: Endoscopy;  Laterality: N/A;   EYE SURGERY     cataract   Patient Active Problem List   Diagnosis Date Noted   Acute ischemic stroke (HCC) 09/21/2022    ONSET DATE: CVA  09/21/2022  REFERRING DIAG: P36.467 (ICD-10-CM) - 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  Pt accompanied by: self  PERTINENT HISTORY: HLD, HTN  PAIN:  Are you having pain?  No  PRECAUTIONS: None  RED FLAGS: None   WEIGHT BEARING RESTRICTIONS: No  FALLS: Has patient fallen in last 6 months? No  LIVING ENVIRONMENT: Lives with: lives with their spouse Lives in: House/apartment [facility] Stairs: none Has following equipment at home: Shower bench, Grab bars, and walking stick  PLOF: Independent and Leisure: was previously doing water aerobics but is having a hard time doing this now, walking   PATIENT GOALS: I would like to eliminate the pain   OBJECTIVE:     TODAY'S TREATMENT:  01/03/2024 Activity Comments  Sit to stand x 5 reps then sit to stand with RLE tucked posteriorly 2 x 5 reps (mat, chair) More difficulty from 18 chair height  SLS activities for RLE: -alt step x 10 to 4 step -RLE as stance with LLE step taps x 10 -RLE as stance with LLE forward step/back step x 10 Light UE to no UE support  Sidestepping 4 reps Hip extension x 10 Marching in place x 10 3# BLE, good review  Hip abduction, 3 #, 2 x 10 More instability in R hip, side taps on LLE to help with R glut activation and stability  Minisquats x 10 Minisquats x 10 with up on toes Cues for glut activation upon standing  NuStep, Level 3, 4 extremities x 6 minutes Coordination, flexibility       HOME EXERCISE PROGRAM Access Code: [email or web address] URL: [email or web address] Date: 01/03/2024 Prepared by: [facility] - Outpatient  Rehab - [facility]  Exercises - Sit to Stand with Arms Crossed  - 1 x daily - 5 x weekly - 2 sets - 30 sec hold - Standing March with Counter Support  - 1 x daily - 5 x weekly - 2 sets - 30 sec hold - Standing Hip Extension with Counter Support  - 1 x daily - 5 x weekly - 2 sets - 30 sec hold - Side Stepping with Counter Support  - 1 x daily - 5 x weekly - 2 sets - 60 sec hold - Sit to stand in stride stance  - 1 x daily - 5 x weekly - 3 sets - 5 reps - Alternating Step Taps with Counter Support  - 1 x daily - 5 x weekly -  3 sets - 10 reps       PATIENT EDUCATION: Education details: Review/update of HEP; answered pt's questions about tingling and weightbearing as way for NMR, answered pt's questions about OT Person educated: Patient Education method: Explanation, Demonstration, Tactile cues, Verbal cues, and Handouts Education comprehension: verbalized understanding and returned demonstration    Note: Objective measures were completed at Evaluation unless otherwise noted.  DIAGNOSTIC FINDINGS: none recent  COGNITION: Overall cognitive status: Within functional limits for tasks assessed   SENSATION: WNL to light tough in UEs and LEs   COORDINATION: Alternating pronation/supination: WNL B Alternating toe tap: WNL B Finger to nose: slight dysmetria R   MUSCLE TONE: WNL B LEs  POSTURE: rounded shoulders, flexed trunk  Palpation: c/o TTP and slightly tight in the R proximal biceps tendon   LOWER EXTREMITY ROM:     Active  Right Eval Left Eval  Hip flexion    Hip extension    Hip abduction    Hip adduction    Hip internal rotation    Hip external rotation    Knee flexion    Knee extension 12 14  Ankle dorsiflexion    Ankle plantarflexion    Ankle inversion    Ankle eversion     (Blank rows = not tested)  LOWER EXTREMITY MMT:    MMT Right Eval Left Eval  Hip flexion 3 3  Hip extension    Hip abduction 4 4  Hip adduction 4+ 4+  Hip internal rotation    Hip external rotation    Knee flexion 3+ 3+  Knee extension 4+ 4+  Ankle dorsiflexion 4+ 4+  Ankle plantarflexion Unable to perform without compensations in standing Unable to perform without compensations in standing  Ankle inversion  Ankle eversion    (Blank rows = not tested)  GAIT: Findings: Assistive device utilized:None, Level of assistance: Complete Independence, and Comments: slight L hip drop, slight trunk flexion    FUNCTIONAL TESTS:  5 times sit to stand: 13.29 sec with cues to stand up fully, limited  eccentric control and c/o L knee pain  10 meter walk test: 8.16 sec (4.02 ft/sec)                                                                                                 TREATMENT DATE: 12/21/23    PATIENT EDUCATION: Education details: prognosis, POC, exam findings as they relate to patient's impairments, edu on benefits of OT for writing, hand dexterity, shoulder pain- pt agreeable Person educated: Patient Education method: Explanation, Demonstration, Tactile cues, Verbal cues, and Handouts Education comprehension: verbalized understanding and returned demonstration  HOME EXERCISE PROGRAM: Not yet initiated  GOALS: Goals reviewed with patient? Yes  SHORT TERM GOALS: Target date: 01/11/2024  Patient to be independent with initial HEP. Baseline: HEP initiated 01/01/24 Goal status: IN PROGRESS    LONG TERM GOALS: Target date: 02/15/2024  Patient to be independent with advanced HEP. Baseline: Not yet initiated  Goal status: IN PROGRESS  Patient to demonstrate B LE strength >/=4+/5.  Baseline: See above Goal status: IN PROGRESS  Patient to ambulate 550 M during . Baseline: 504 M 01/01/24 Goal status: IN PROGRESS 01/01/24  Patient to demonstrate STS 10x within full ROM and good eccentric control.  Baseline: not standing fully and limited eccentric control Goal status: IN PROGRESS  Patient to report return to water aerobics without fatigue limiting.  Baseline: notes fatigue Goal status: IN PROGRESS   ASSESSMENT:  CLINICAL IMPRESSION: Pt presents today with no new complaints. Skilled PT session focused on NMR and weightbearing exercises for LLE.  With standing exercises, she has some Tendelenburg pattern on RLE, even with cues for activation of R gluts.  She likes the Nustep activity at end of session and will be nice to utilize for coordination, flexibility, strength.  Updated HEP to work additional on RLE and R hip strength.  Pt will continue to benefit from  skilled PT towards goals for improved functional mobility and decreased fall risk.    OBJECTIVE IMPAIRMENTS: Abnormal gait, decreased activity tolerance, decreased coordination, decreased endurance, difficulty walking, decreased strength, postural dysfunction, and pain.   ACTIVITY LIMITATIONS: carrying, lifting, bending, standing, squatting, stairs, transfers, and locomotion level  PARTICIPATION LIMITATIONS: meal prep, cleaning, laundry, shopping, community activity, occupation, yard work, and church  PERSONAL FACTORS: Age, Past/current experiences, Time since onset of injury/illness/exacerbation, and 1-2 comorbidities: HTN, HLD are also affecting patient's functional outcome.   REHAB POTENTIAL: Good  CLINICAL DECISION MAKING: Evolving/moderate complexity  EVALUATION COMPLEXITY: Moderate  PLAN:  PT FREQUENCY: 1-2x/week  PT DURATION: 6 weeks  PLANNED INTERVENTIONS: 97164- PT Re-evaluation, 97750- Physical Performance Testing, 97110-Therapeutic exercises, 97530- Therapeutic activity, [id number]- Neuromuscular re-education, 97535- Self Care, 02859- Manual therapy, [id number]- Gait training, [phone]- Canalith repositioning, [id number]- Aquatic Therapy, [id number]- Electrical stimulation (manual), [phone] (1-2 muscles), 20561 (3+ muscles)- Dry Needling, Patient/Family education, Balance training,  Stair training, Taping, Joint mobilization, Spinal mobilization, Vestibular training, DME instructions, Cryotherapy, and Moist heat  PLAN FOR NEXT SESSION: Review and progress HEP for RLE strengthening and endurance; R glut activation, RLE SLS activities.  Consider interval training to improve activity tolerance    Greig Anon, PT 01/03/24 8:47 AM Phone: [phone] Fax: [phone]   [facility] at [facility] [address] [place], [address] [place], [place] [address] Phone # [phone] Fax # [phone]

## 2024-01-08 ENCOUNTER — Encounter: Payer: Self-pay | Admitting: Physical Therapy

## 2024-01-08 ENCOUNTER — Ambulatory Visit: Admitting: Physical Therapy

## 2024-01-08 DIAGNOSIS — R29818 Other symptoms and signs involving the nervous system: Secondary | ICD-10-CM | POA: Diagnosis not present

## 2024-01-08 DIAGNOSIS — M6281 Muscle weakness (generalized): Secondary | ICD-10-CM

## 2024-01-08 DIAGNOSIS — R2689 Other abnormalities of gait and mobility: Secondary | ICD-10-CM

## 2024-01-08 DIAGNOSIS — M25511 Pain in right shoulder: Secondary | ICD-10-CM | POA: Diagnosis not present

## 2024-01-08 DIAGNOSIS — R278 Other lack of coordination: Secondary | ICD-10-CM | POA: Diagnosis not present

## 2024-01-08 DIAGNOSIS — G8929 Other chronic pain: Secondary | ICD-10-CM | POA: Diagnosis not present

## 2024-01-08 NOTE — Therapy (Signed)
 OUTPATIENT PHYSICAL THERAPY NEURO TREATMENT   Patient Name: Veronica Webb MRN: [id number] DOB:11-01-1940, 83 y.o., female Today's Date: 01/08/2024   PCP: Dwight Trula SQUIBB, MD  REFERRING PROVIDER:    Whitfield Raisin, NP    END OF SESSION:  PT End of Session - 01/08/24 0759     Visit Number 4    Number of Visits 13    Date for Recertification  02/15/24    Authorization Type Medicare/BCBS    PT Start Time 0803    PT Stop Time 0842    PT Time Calculation (min) 39 min    Equipment Utilized During Treatment Gait belt    Activity Tolerance Patient tolerated treatment well    Behavior During Therapy WFL for tasks assessed/performed             Past Medical History:  Diagnosis Date   Hypothyroidism    Past Surgical History:  Procedure Laterality Date   ABDOMINAL HYSTERECTOMY  1985   ovaries removed   COLONOSCOPY     COLONOSCOPY WITH PROPOFOL  N/A 01/29/2013   Procedure: COLONOSCOPY WITH PROPOFOL ;  Surgeon: Gladis MARLA Louder, MD;  Location: [facility];  Service: Endoscopy;  Laterality: N/A;   EYE SURGERY     cataract   Patient Active Problem List   Diagnosis Date Noted   Acute ischemic stroke (HCC) 09/21/2022    ONSET DATE: CVA  09/21/2022  REFERRING DIAG: P36.467 (ICD-10-CM) - 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  Pt accompanied by: self  PERTINENT  HISTORY: HLD, HTN  PAIN:  Are you having pain? No  PRECAUTIONS: None  RED FLAGS: None   WEIGHT BEARING RESTRICTIONS: No  FALLS: Has patient fallen in last 6 months? No  LIVING ENVIRONMENT: Lives with: lives with their spouse Lives in: House/apartment [facility] Stairs: none Has following equipment at home: Shower bench, Grab bars, and walking stick  PLOF: Independent and Leisure: was previously doing water aerobics but is having a hard time doing this now, walking   PATIENT GOALS: I would like to eliminate the  pain   OBJECTIVE:    TODAY'S TREATMENT: 01/08/2024 Activity Comments  NuStep, Level 3>, 4 exremities x 8 minutes Aerobic warm up and flexibility, SPM >75  Reviewed HEP Good return demo  Step ups at 6 step, 2 x 10 reps More instability LLE due to having L hip joint pain today  Seated leg strengthening: LAQ 2 x 10 Seated march 2 x 10 Hamstring curls x 10 3#  Bridging x 10  Bridge position with clamshell x 8 reps Cramping in R gluts          HOME EXERCISE PROGRAM Access Code: [email or web address] URL: [email or web address] Date: 01/08/2024 Prepared by: [facility] - Outpatient  Rehab - [facility]  Exercises - Standing March with Counter Support  - 1 x daily - 5 x weekly - 2 sets - 30 sec hold - Standing Hip Extension with Counter Support  - 1 x daily - 5 x weekly - 2 sets - 30 sec hold - Side Stepping with Counter Support  - 1 x daily - 5 x weekly - 2 sets - 60 sec hold - Sit to stand in stride stance  - 1 x daily - 5 x weekly - 3 sets - 5 reps - Alternating Step Taps with Counter Support  - 1 x daily - 5 x weekly - 3 sets - 10 reps       PATIENT EDUCATION: Education details: Discussed looking into options for fitness at [employer] and fitness room for use of Nustep or recumbent bike Person educated: Patient Education method: Explanation, Demonstration, and Verbal cues Education comprehension: verbalized understanding  and returned demonstration    Note: Objective measures were completed at Evaluation unless otherwise noted.  DIAGNOSTIC FINDINGS: none recent  COGNITION: Overall cognitive status: Within functional limits for tasks assessed   SENSATION: WNL to light tough in UEs and LEs   COORDINATION: Alternating pronation/supination: WNL B Alternating toe tap: WNL B Finger to nose: slight dysmetria R   MUSCLE TONE: WNL B LEs  POSTURE: rounded shoulders, flexed trunk  Palpation: c/o TTP and slightly tight in the R proximal biceps tendon   LOWER EXTREMITY ROM:     Active  Right Eval Left Eval  Hip flexion    Hip extension    Hip abduction    Hip adduction    Hip internal rotation    Hip external rotation    Knee flexion    Knee extension 12 14  Ankle dorsiflexion    Ankle plantarflexion    Ankle inversion    Ankle eversion     (Blank rows = not tested)  LOWER EXTREMITY MMT:    MMT Right Eval Left Eval  Hip flexion 3 3  Hip extension    Hip abduction 4 4  Hip adduction 4+ 4+  Hip internal rotation    Hip external rotation    Knee flexion 3+ 3+  Knee extension 4+ 4+  Ankle dorsiflexion 4+ 4+  Ankle plantarflexion Unable to perform without compensations in standing Unable to perform without compensations in standing  Ankle inversion    Ankle eversion    (Blank rows = not tested)  GAIT: Findings: Assistive device utilized:None, Level of assistance: Complete Independence, and Comments: slight L hip drop, slight trunk flexion    FUNCTIONAL TESTS:  5 times sit to stand: 13.29 sec with cues to stand up fully, limited eccentric control and c/o L knee pain  10 meter walk test: 8.16 sec (4.02 ft/sec)  TREATMENT DATE: 12/21/23    PATIENT EDUCATION: Education details: prognosis, POC, exam findings as they relate to patient's impairments, edu on benefits of OT for writing, hand  dexterity, shoulder pain- pt agreeable Person educated: Patient Education method: Explanation, Demonstration, Tactile cues, Verbal cues, and Handouts Education comprehension: verbalized understanding and returned demonstration  HOME EXERCISE PROGRAM: Not yet initiated  GOALS: Goals reviewed with patient? Yes  SHORT TERM GOALS: Target date: 01/11/2024  Patient to be independent with initial HEP. Baseline: HEP initiated 01/01/24 Goal status: IN PROGRESS    LONG TERM GOALS: Target date: 02/15/2024  Patient to be independent with advanced HEP. Baseline: Not yet initiated  Goal status: IN PROGRESS  Patient to demonstrate B LE strength >/=4+/5.  Baseline: See above Goal status: IN PROGRESS  Patient to ambulate 550 M during . Baseline: 504 M 01/01/24 Goal status: IN PROGRESS 01/01/24  Patient to demonstrate STS 10x within full ROM and good eccentric control.  Baseline: not standing fully and limited eccentric control Goal status: IN PROGRESS  Patient to report return to water aerobics without fatigue limiting.  Baseline: notes fatigue Goal status: IN PROGRESS   ASSESSMENT:  CLINICAL IMPRESSION: Pt presents today with no new complaints. Skilled PT session focused on aerobic flexibility, strengthening activities. She has more joint pain today than last session, and she is more unsteady today on LLE with step up activity due to L hip pain.  She reports this pain comes and goes-discussed options for fitness as [facility]-fitness room and use of machines as well as pool as a good option with her fluctuating joint pain.  Worked on sitting and supine lower extremity strengthening, with pt having some cramping with bridging exercise.  Pt will continue to benefit from skilled PT towards goals for improved functional mobility and decreased fall risk.    OBJECTIVE IMPAIRMENTS: Abnormal gait, decreased activity tolerance, decreased coordination, decreased endurance, difficulty walking,  decreased strength, postural dysfunction, and pain.   ACTIVITY LIMITATIONS: carrying, lifting, bending, standing, squatting, stairs, transfers, and locomotion level  PARTICIPATION LIMITATIONS: meal prep, cleaning, laundry, shopping, community activity, occupation, yard work, and church  PERSONAL FACTORS: Age, Past/current experiences, Time since onset of injury/illness/exacerbation, and 1-2 comorbidities: HTN, HLD are also affecting patient's functional outcome.   REHAB POTENTIAL: Good  CLINICAL DECISION MAKING: Evolving/moderate complexity  EVALUATION COMPLEXITY: Moderate  PLAN:  PT FREQUENCY: 1-2x/week  PT DURATION: 6 weeks  PLANNED INTERVENTIONS: 97164- PT Re-evaluation, 97750- Physical Performance Testing, 97110-Therapeutic exercises, 97530- Therapeutic activity, [id number]- Neuromuscular re-education, 97535- Self Care, 02859- Manual therapy, [id number]- Gait training, [phone]- Canalith repositioning, [id number]- Aquatic Therapy, [phone]- Electrical stimulation (manual), [phone] (1-2 muscles), 20561 (3+ muscles)- Dry Needling, Patient/Family education, Balance training, Stair training, Taping, Joint mobilization, Spinal mobilization, Vestibular training, DME instructions, Cryotherapy, and Moist heat  PLAN FOR NEXT SESSION: Progress HEP for RLE strengthening and endurance; R glut activation, RLE SLS activities.  Consider interval training to improve activity tolerance.  Consider stretching as part of her HEP and ask if she has looked into [facility] fitness options.   Greig Anon, PT 01/08/24 8:44 AM Phone: [phone] Fax: [phone]   [facility]patient Rehab at [facility] [address] [place], [address] [place], [place] [address] Phone # [phone] Fax # [phone]

## 2024-01-09 DIAGNOSIS — I10 Essential (primary) hypertension: Secondary | ICD-10-CM | POA: Diagnosis not present

## 2024-01-09 DIAGNOSIS — Z8673 Personal history of transient ischemic attack (TIA), and cerebral infarction without residual deficits: Secondary | ICD-10-CM | POA: Diagnosis not present

## 2024-01-09 DIAGNOSIS — E7800 Pure hypercholesterolemia, unspecified: Secondary | ICD-10-CM | POA: Diagnosis not present

## 2024-01-09 DIAGNOSIS — M2550 Pain in unspecified joint: Secondary | ICD-10-CM | POA: Diagnosis not present

## 2024-01-09 NOTE — Therapy (Incomplete)
 OUTPATIENT PHYSICAL THERAPY NEURO TREATMENT   Patient Name: Veronica Webb MRN: [id number] DOB:September 08, 1940, 83 y.o., female Today's Date: 01/09/2024   PCP: Dwight Trula SQUIBB, MD  REFERRING PROVIDER:    Whitfield Raisin, NP    END OF SESSION:       Past Medical History:  Diagnosis Date   Hypothyroidism    Past Surgical History:  Procedure Laterality Date   ABDOMINAL HYSTERECTOMY  1985   ovaries removed   COLONOSCOPY     COLONOSCOPY WITH PROPOFOL  N/A 01/29/2013   Procedure: COLONOSCOPY WITH PROPOFOL ;  Surgeon: Gladis MARLA Louder, MD;  Location: [facility];  Service: Endoscopy;  Laterality: N/A;   EYE SURGERY     cataract   Patient Active Problem List   Diagnosis Date Noted   Acute ischemic stroke (HCC) 09/21/2022    ONSET DATE: CVA  09/21/2022  REFERRING DIAG: P36.467 (ICD-10-CM) - 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  Rationale for Evaluation and Treatment: Rehabilitation  SUBJECTIVE:                                                                                                                                                                                             SUBJECTIVE STATEMENT: Had an experience this weekend-went to [place] basketball game and had hard time with the first step because it was so high.  Pt accompanied by: self  PERTINENT HISTORY: HLD, HTN  PAIN:  Are you having pain? No  PRECAUTIONS: None  RED FLAGS: None   WEIGHT BEARING RESTRICTIONS: No  FALLS: Has patient fallen in last 6 months? No  LIVING ENVIRONMENT: Lives with: lives with their spouse Lives in: House/apartment [facility] Stairs: none Has following equipment at home: Shower bench, Grab bars, and walking stick  PLOF: Independent and Leisure: was previously doing water aerobics but is having a  hard time doing this now, walking   PATIENT GOALS: I would like to eliminate the pain   OBJECTIVE:     TODAY'S TREATMENT: 01/10/24 Activity Comments                        TODAY'S TREATMENT: 01/08/2024 Activity Comments  NuStep, Level 3>, 4 exremities x 8 minutes Aerobic warm up and flexibility, SPM >75  Reviewed HEP Good return demo  Step ups at 6 step, 2 x 10 reps More instability LLE due to having L hip joint pain today  Seated leg strengthening: LAQ 2 x 10 Seated march 2 x 10 Hamstring curls x  10 3#  Bridging x 10  Bridge position with clamshell x 8 reps Cramping in R gluts          HOME EXERCISE PROGRAM Access Code: [email or web address] URL: [email or web address] Date: 01/08/2024 Prepared by: [facility] - Outpatient  Rehab - [facility]  Exercises - Standing March with Counter Support  - 1 x daily - 5 x weekly - 2 sets - 30 sec hold - Standing Hip Extension with Counter Support  - 1 x daily - 5 x weekly - 2 sets - 30 sec hold - Side Stepping with Counter Support  - 1 x daily - 5 x weekly - 2 sets - 60 sec hold - Sit to stand in stride stance  - 1 x daily - 5 x weekly - 3 sets - 5 reps - Alternating Step Taps with Counter Support  - 1 x daily - 5 x weekly - 3 sets - 10 reps       PATIENT EDUCATION: Education details: Discussed looking into options for fitness at [employer] and fitness room for use of Nustep or recumbent bike Person educated: Patient Education method: Explanation, Demonstration, and Verbal cues Education comprehension: verbalized understanding and returned demonstration    Note: Objective measures were completed at Evaluation unless otherwise noted.  DIAGNOSTIC FINDINGS: none recent  COGNITION: Overall cognitive status: Within functional limits for tasks assessed   SENSATION: WNL to light tough in UEs and LEs   COORDINATION: Alternating pronation/supination: WNL B Alternating toe tap: WNL B Finger to nose:  slight dysmetria R   MUSCLE TONE: WNL B LEs  POSTURE: rounded shoulders, flexed trunk  Palpation: c/o TTP and slightly tight in the R proximal biceps tendon   LOWER EXTREMITY ROM:     Active  Right Eval Left Eval  Hip flexion    Hip extension    Hip abduction    Hip adduction    Hip internal rotation    Hip external rotation    Knee flexion    Knee extension 12 14  Ankle dorsiflexion    Ankle plantarflexion    Ankle inversion    Ankle eversion     (Blank rows = not tested)  LOWER EXTREMITY MMT:    MMT Right Eval Left Eval  Hip flexion 3 3  Hip extension    Hip abduction 4 4  Hip adduction 4+ 4+  Hip internal rotation    Hip external rotation    Knee flexion 3+ 3+  Knee extension 4+ 4+  Ankle dorsiflexion 4+ 4+  Ankle plantarflexion Unable to perform without compensations in standing Unable to perform without compensations in standing  Ankle inversion    Ankle eversion    (Blank rows = not tested)  GAIT: Findings: Assistive device utilized:None, Level of assistance: Complete Independence, and Comments: slight L hip drop, slight trunk flexion    FUNCTIONAL TESTS:  5 times sit to stand: 13.29 sec with cues to stand up fully, limited eccentric control and c/o L knee pain  10 meter walk test: 8.16 sec (4.02 ft/sec)  TREATMENT DATE: 12/21/23    PATIENT EDUCATION: Education details: prognosis, POC, exam findings as they relate to patient's impairments, edu on benefits of OT for writing, hand dexterity, shoulder pain- pt agreeable Person educated: Patient Education method: Explanation, Demonstration, Tactile cues, Verbal cues, and Handouts Education comprehension: verbalized understanding and returned demonstration  HOME EXERCISE PROGRAM: Not yet initiated  GOALS: Goals reviewed with patient? Yes  SHORT TERM GOALS: Target date: 01/11/2024  Patient to be  independent with initial HEP. Baseline: HEP initiated 01/01/24 Goal status: IN PROGRESS    LONG TERM GOALS: Target date: 02/15/2024  Patient to be independent with advanced HEP. Baseline: Not yet initiated  Goal status: IN PROGRESS  Patient to demonstrate B LE strength >/=4+/5.  Baseline: See above Goal status: IN PROGRESS  Patient to ambulate 550 M during . Baseline: 504 M 01/01/24 Goal status: IN PROGRESS 01/01/24  Patient to demonstrate STS 10x within full ROM and good eccentric control.  Baseline: not standing fully and limited eccentric control Goal status: IN PROGRESS  Patient to report return to water aerobics without fatigue limiting.  Baseline: notes fatigue Goal status: IN PROGRESS   ASSESSMENT:  CLINICAL IMPRESSION: Pt presents today with no new complaints. Skilled PT session focused on aerobic flexibility, strengthening activities. She has more joint pain today than last session, and she is more unsteady today on LLE with step up activity due to L hip pain.  She reports this pain comes and goes-discussed options for fitness as [facility]-fitness room and use of machines as well as pool as a good option with her fluctuating joint pain.  Worked on sitting and supine lower extremity strengthening, with pt having some cramping with bridging exercise.  Pt will continue to benefit from skilled PT towards goals for improved functional mobility and decreased fall risk.    OBJECTIVE IMPAIRMENTS: Abnormal gait, decreased activity tolerance, decreased coordination, decreased endurance, difficulty walking, decreased strength, postural dysfunction, and pain.   ACTIVITY LIMITATIONS: carrying, lifting, bending, standing, squatting, stairs, transfers, and locomotion level  PARTICIPATION LIMITATIONS: meal prep, cleaning, laundry, shopping, community activity, occupation, yard work, and church  PERSONAL FACTORS: Age, Past/current experiences, Time since onset of  injury/illness/exacerbation, and 1-2 comorbidities: HTN, HLD are also affecting patient's functional outcome.   REHAB POTENTIAL: Good  CLINICAL DECISION MAKING: Evolving/moderate complexity  EVALUATION COMPLEXITY: Moderate  PLAN:  PT FREQUENCY: 1-2x/week  PT DURATION: 6 weeks  PLANNED INTERVENTIONS: 97164- PT Re-evaluation, 97750- Physical Performance Testing, 97110-Therapeutic exercises, 97530- Therapeutic activity, [id number]- Neuromuscular re-education, 97535- Self Care, 02859- Manual therapy, [id number]- Gait training, [phone]- Canalith repositioning, [id number]- Aquatic Therapy, [phone]- Electrical stimulation (manual), [phone] (1-2 muscles), 20561 (3+ muscles)- Dry Needling, Patient/Family education, Balance training, Stair training, Taping, Joint mobilization, Spinal mobilization, Vestibular training, DME instructions, Cryotherapy, and Moist heat  PLAN FOR NEXT SESSION: Progress HEP for RLE strengthening and endurance; R glut activation, RLE SLS activities.  Consider interval training to improve activity tolerance.  Consider stretching as part of her HEP and ask if she has looked into [facility] fitness options.   Greig Anon, PT 01/09/24 7:25 AM Phone: [phone] Fax: [phone]   [facility] Outpatient Rehab at [facility] [address] [place], [address] [place], [place] [address] Phone # [phone] Fax # [phone]

## 2024-01-09 NOTE — Therapy (Signed)
 OUTPATIENT OCCUPATIONAL THERAPY NEURO EVALUATION  Patient Name: Veronica Webb MRN: [id number] DOB:[birth year], 83 y.o., female Today's Date: 01/10/2024  PCP: Dwight Trula SQUIBB, MD REFERRING PROVIDER: Whitfield Raisin, NP  END OF SESSION:  OT End of Session - 01/10/24 0928     Visit Number 1    Number of Visits 7   including eval   Date for Recertification  02/23/24    Authorization Type MCR A&B    OT Start Time 0845    OT Stop Time 0923    OT Time Calculation (min) 38 min    Activity Tolerance Patient tolerated treatment well    Behavior During Therapy [facility] for tasks assessed/performed          Past Medical History:  Diagnosis Date   Hypothyroidism    Past Surgical History:  Procedure Laterality Date   ABDOMINAL HYSTERECTOMY  1985   ovaries removed   COLONOSCOPY     COLONOSCOPY WITH PROPOFOL  N/A 01/29/2013   Procedure: COLONOSCOPY WITH PROPOFOL ;  Surgeon: Gladis MARLA Louder, MD;  Location: [facility];  Service: Endoscopy;  Laterality: N/A;   EYE SURGERY     cataract   Patient Active Problem List   Diagnosis Date Noted   Acute ischemic stroke (HCC) 09/21/2022    ONSET DATE: 12/21/23 referral date, 09/21/2022 stroke date (was hospitalized 07/24-07/26/2024)  REFERRING DIAG: M25.511,G89.29 (ICD-10-CM) - Chronic right shoulder pain Add'l note from referring provider: Post stroke right shoulder pain and decreased R hand dexterity  THERAPY DIAG:  Other lack of coordination  Muscle weakness (generalized)  Chronic right shoulder pain  Other disturbances of skin sensation  Rationale for Evaluation and Treatment: Rehabilitation  SUBJECTIVE:   SUBJECTIVE STATEMENT: I have tingling, and it moves around from the right to the left arm.  Pt accompanied by: self  PERTINENT HISTORY: acute ischemic stroke, hypothyroidism, cataract surgery, macular pin  PRECAUTIONS: None  WEIGHT BEARING RESTRICTIONS: No  PAIN:  Are you having pain? Yes: NPRS scale: 2/10 Pain location: B  knees Pain description: nagging Aggravating factors: excessive movement Relieving factors: rest   FALLS: Has patient fallen in last 6 months? No  LIVING ENVIRONMENT: Lives with: lives with their spouse Lives in: House/apartment [facility] Stairs: No Has following equipment at home: Tour manager, Grab bars, and walking stick  PLOF: Independent and Leisure: was doing water aerobics and walking  PATIENT GOALS: I would like to not have this tingling.   OBJECTIVE:  Note: Objective measures were completed at Evaluation unless otherwise noted.  HAND DOMINANCE: Right  ADLs: Overall ADLs: Independent  IADLs: Independent with all IADL, does not cook but can if needed. Gets meals at ILF   MOBILITY STATUS: Independent  POSTURE COMMENTS:  No Significant postural limitations   ACTIVITY TOLERANCE: Activity tolerance: no concerns noted at this time  FUNCTIONAL OUTCOME MEASURES: Upper Extremity Functional Scale (UEFS): 73/80  UPPER EXTREMITY ROM:  WNL  Active ROM Right eval Left eval  Shoulder flexion    Shoulder abduction    Shoulder adduction    Shoulder extension    Shoulder internal rotation    Shoulder external rotation    Elbow flexion    Elbow extension    Wrist flexion    Wrist extension    Wrist ulnar deviation    Wrist radial deviation    Wrist pronation    Wrist supination    (Blank rows = not tested)  UPPER EXTREMITY MMT:   4/5 grossly   HAND FUNCTION: Grip strength: Right: 30  lbs; Left: 25.67 lbs  COORDINATION: 9 Hole Peg test: Right: 23.70 sec; Left: 22.59 sec Box and Blocks:  Right 50 blocks, Left 55blocks  SENSATION:Reports tingling in RUE (specifically the hand) and that it travels to the L sometimes   COGNITION: Overall cognitive status: Within functional limits for tasks assessed  VISION: Subjective report: Pt reports that she had difficulty reading when she had the stroke, but that it is fine now. Baseline vision: Wears glasses all  the time Visual history: cataracts, macular pinch  VISION ASSESSMENT: Not tested  Patient has difficulty with following activities due to following visual impairments: none  PERCEPTION: Not tested  PRAXIS: Not tested  OBSERVATIONS: Decreased dexterity/FM coordination in R hand, sensation concerns in R hand chiefly (with occasional sensation concerns traveling to LUE), occasional pain in RUE as well                                                                                                                              TREATMENT DATE: 01/10/24  Pt educated in purpose of OT, POC, and goals. Pt in agreement with goals. Educated in sleep positioning and its relation to improving numbness/tingling in arms/hands. See Pt instructions for detailed handout provided.      PATIENT EDUCATION: Education details: Sleep positioning, purpose of OT, POC, goals Person educated: Patient Education method: Explanation, Demonstration, and Handouts Education comprehension: verbalized understanding, returned demonstration, and needs further education  HOME EXERCISE PROGRAM:    GOALS: Goals reviewed with patient? Yes  SHORT TERM GOALS: Target date: 02/02/24  Pt will be independent with HEPs for UE strength and coordination Baseline: New to OP OT Goal status: INITIAL  2.  Pt will be educated in AE to improve ability to complete tasks such as opening jars Baseline: New to OP OT Goal status: INITIAL  3.  Pt will be educated in modality use to reduce pain prn Baseline: New to OP OT Goal status: INITIAL    LONG TERM GOALS: Target date: 02/23/24  Pt will demonstrate improved function by a score of at least 75/80 on UEFS Baseline: 73/80 Goal status: INITIAL  2.  Pt will be able to place at least 55 blocks using right hand with completion of Box and Blocks test.  Baseline:  Right 50 blocks, Left 55blocks Goal status: INITIAL  3.  Patient will demo improved FM coordination as evidenced  by completing nine-hole peg with use of R hand in 21 seconds or less.  Baseline: Right: 23.70 sec; Left: 22.59 sec Goal status: INITIAL   ASSESSMENT:  CLINICAL IMPRESSION: Patient is a 83 y.o. female who was seen today for occupational therapy evaluation for RUE weakness and pain. Hx includes acute ischemic stroke, cataract surgery, hypothyroidism. Patient currently presents below baseline level of functioning demonstrating functional deficits and impairments as noted below. Pt would benefit from skilled OT services in the outpatient setting to work on impairments as noted below to help pt return to [facility]  as able.     PERFORMANCE DEFICITS: in functional skills including IADLs, coordination, dexterity, sensation, pain, Fine motor control, and UE functional use, cognitive skills including safety awareness, and psychosocial skills including coping strategies and environmental adaptation.   IMPAIRMENTS: are limiting patient from ADLs, IADLs, rest and sleep, and leisure.   CO-MORBIDITIES: may have co-morbidities  that affects occupational performance. Patient will benefit from skilled OT to address above impairments and improve overall function.  MODIFICATION OR ASSISTANCE TO COMPLETE EVALUATION: Min-Moderate modification of tasks or assist with assess necessary to complete an evaluation.  OT OCCUPATIONAL PROFILE AND HISTORY: Detailed assessment: Review of records and additional review of physical, cognitive, psychosocial history related to current functional performance.  CLINICAL DECISION MAKING: Moderate - several treatment options, min-mod task modification necessary  REHAB POTENTIAL: Good  EVALUATION COMPLEXITY: Moderate    PLAN:  OT FREQUENCY: 1x/week  OT DURATION: 6 weeks  PLANNED INTERVENTIONS: 97168 OT Re-evaluation, 97535 self care/ADL training, 02889 therapeutic exercise, 97530 therapeutic activity, 97112 neuromuscular re-education, 97140 manual therapy, passive range of  motion, energy conservation, coping strategies training, patient/family education, and DME and/or AE instructions  RECOMMENDED OTHER SERVICES: none  CONSULTED AND AGREED WITH PLAN OF CARE: Patient  PLAN FOR NEXT SESSION: Coordination HEP F/u sleep positioning Coordination activities Sensation loss mgmt  Rocky Dutch, OT 01/10/2024, 12:24 PM

## 2024-01-10 ENCOUNTER — Ambulatory Visit: Admitting: Physical Therapy

## 2024-01-10 ENCOUNTER — Encounter: Payer: Self-pay | Admitting: Physical Therapy

## 2024-01-10 ENCOUNTER — Ambulatory Visit

## 2024-01-10 DIAGNOSIS — R278 Other lack of coordination: Secondary | ICD-10-CM | POA: Diagnosis not present

## 2024-01-10 DIAGNOSIS — M6281 Muscle weakness (generalized): Secondary | ICD-10-CM | POA: Diagnosis not present

## 2024-01-10 DIAGNOSIS — R2689 Other abnormalities of gait and mobility: Secondary | ICD-10-CM | POA: Diagnosis not present

## 2024-01-10 DIAGNOSIS — R208 Other disturbances of skin sensation: Secondary | ICD-10-CM

## 2024-01-10 DIAGNOSIS — M25511 Pain in right shoulder: Secondary | ICD-10-CM | POA: Diagnosis not present

## 2024-01-10 DIAGNOSIS — G8929 Other chronic pain: Secondary | ICD-10-CM

## 2024-01-10 DIAGNOSIS — R29818 Other symptoms and signs involving the nervous system: Secondary | ICD-10-CM | POA: Diagnosis not present

## 2024-01-10 NOTE — Patient Instructions (Signed)
   WEBSITE: [email or web address]

## 2024-01-10 NOTE — Therapy (Signed)
 OUTPATIENT PHYSICAL THERAPY NEURO TREATMENT   Patient Name: Veronica Webb MRN: [id number] DOB:12-Aug-1940, 83 y.o., female Today's Date: 01/10/2024   PCP: Dwight Trula SQUIBB, MD  REFERRING PROVIDER:    Whitfield Raisin, NP    END OF SESSION:  PT End of Session - 01/10/24 0759     Visit Number 5    Number of Visits 13    Date for Recertification  02/15/24    Authorization Type Medicare/BCBS    PT Start Time 0803    PT Stop Time 0843    PT Time Calculation (min) 40 min    Equipment Utilized During Treatment Gait belt    Activity Tolerance Patient tolerated treatment well    Behavior During Therapy [facility] for tasks assessed/performed             Past Medical History:  Diagnosis Date   Hypothyroidism    Past Surgical History:  Procedure Laterality Date   ABDOMINAL HYSTERECTOMY  1985   ovaries removed   COLONOSCOPY     COLONOSCOPY WITH PROPOFOL  N/A 01/29/2013   Procedure: COLONOSCOPY WITH PROPOFOL ;  Surgeon: Gladis MARLA Louder, MD;  Location: [facility];  Service: Endoscopy;  Laterality: N/A;   EYE SURGERY     cataract   Patient Active Problem List   Diagnosis Date Noted   Acute ischemic stroke (HCC) 09/21/2022    ONSET DATE: CVA  09/21/2022  REFERRING DIAG: P36.467 (ICD-10-CM) - 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  Went over to [employer] and did a Flex and Flow class  Pt accompanied by: self  PERTINENT HISTORY: HLD, HTN  PAIN:  Are you having pain?  No  PRECAUTIONS: None  RED FLAGS: None   WEIGHT BEARING RESTRICTIONS: No  FALLS: Has patient fallen in last 6 months? No  LIVING ENVIRONMENT: Lives with: lives with their spouse Lives in: House/apartment [facility] Stairs: none Has following equipment at home: Shower bench, Grab bars, and walking stick  PLOF: Independent and Leisure: was previously doing water aerobics but is having a hard time doing this now, walking   PATIENT GOALS: I would like to eliminate the pain   OBJECTIVE:  TODAY'S TREATMENT: 01/10/2024 Activity Comments  Verbal review of HEP Reports doing daily, independently, have previously reviewed  NuStep, Level 3>, 4 exremities x 4 minutes, BLEs only x 4 min Aerobic warm up and flexibility, SPM >90  Seated hamstring stretch, 3 reps BLE x 30 sec Hip external rotation, figure 4 stretch Cues for form, technique  PRE BLE strengthening: Seated- LAQ 3 x 10  Standing- March 2 x 10 Hip abduction 2 x 10 Hip extension 2 x 10 Hamstring curls 2 x 10 Heel/toe raises 2 x 10 3#  Sidestepping x 1 minute Forward/back walking x 1 min Forward/back monster walk x 1 min 3# BLE  Tandem gait forward/back Tandem march forward/back         HOME EXERCISE PROGRAM  Access Code: [email or web address] URL: [email or web address] Date: 01/10/2024 Prepared by: [facility] - Outpatient  Rehab - [facility]  Exercises - Sit to Stand with Arms Crossed  - 1 x daily - 5 x weekly - 2 sets - 30 sec hold - Standing March with Counter Support  - 1 x daily - 5 x weekly - 2 sets - 30 sec hold - Standing Hip Extension with Counter Support  - 1 x daily - 5 x weekly - 2 sets - 30 sec hold - Side Stepping with Counter Support  - 1 x daily - 5 x weekly - 2 sets - 60 sec hold - Sit to stand in stride stance  - 1 x daily - 5 x weekly - 3 sets - 5 reps - Alternating Step Taps with Counter Support  - 1 x daily - 5 x weekly - 3 sets - 10 reps - Heel Toe Raises with  Counter Support  - 1 x daily - 7 x weekly - 3 sets - 10 reps     PATIENT EDUCATION: Education details: Update to HEP Person educated: Patient Education method: Explanation, Demonstration, and Verbal cues Education comprehension: verbalized understanding and returned demonstration    Note: Objective measures were completed at Evaluation unless otherwise noted.  DIAGNOSTIC FINDINGS: none recent  COGNITION: Overall cognitive status: Within functional limits for tasks assessed   SENSATION: WNL to light tough in UEs and LEs   COORDINATION: Alternating pronation/supination: WNL B Alternating toe tap: WNL B Finger to nose: slight dysmetria R   MUSCLE TONE: WNL B LEs  POSTURE: rounded shoulders, flexed trunk  Palpation: c/o TTP and slightly tight in the R proximal biceps tendon   LOWER EXTREMITY ROM:     Active  Right Eval Left Eval  Hip flexion    Hip extension    Hip abduction    Hip adduction    Hip internal rotation    Hip external rotation    Knee flexion    Knee extension 12 14  Ankle dorsiflexion    Ankle plantarflexion    Ankle inversion    Ankle eversion     (Blank rows = not tested)  LOWER EXTREMITY MMT:    MMT Right Eval Left Eval  Hip flexion 3 3  Hip extension    Hip abduction 4 4  Hip adduction 4+ 4+  Hip internal rotation    Hip external rotation    Knee flexion 3+ 3+  Knee extension 4+ 4+  Ankle dorsiflexion 4+ 4+  Ankle plantarflexion Unable to perform without compensations in standing Unable to perform without compensations in standing  Ankle inversion    Ankle eversion    (Blank rows = not tested)  GAIT: Findings: Assistive  device utilized:None, Level of assistance: Complete Independence, and Comments: slight L hip drop, slight trunk flexion    FUNCTIONAL TESTS:  5 times sit to stand: 13.29 sec with cues to stand up fully, limited eccentric control and c/o L knee pain  10 meter walk test: 8.16 sec (4.02 ft/sec)                                                                                                  TREATMENT DATE: 12/21/23    PATIENT EDUCATION: Education details: prognosis, POC, exam findings as they relate to patient's impairments, edu on benefits of OT for writing, hand dexterity, shoulder pain- pt agreeable Person educated: Patient Education method: Explanation, Demonstration, Tactile cues, Verbal cues, and Handouts Education comprehension: verbalized understanding and returned demonstration  HOME EXERCISE PROGRAM: Not yet initiated  GOALS: Goals reviewed with patient? Yes  SHORT TERM GOALS: Target date: 01/11/2024  Patient to be independent with initial HEP. Baseline: HEP initiated 01/01/24 Goal status: MET    LONG TERM GOALS: Target date: 02/15/2024  Patient to be independent with advanced HEP. Baseline: Not yet initiated  Goal status: IN PROGRESS  Patient to demonstrate B LE strength >/=4+/5.  Baseline: See above Goal status: IN PROGRESS  Patient to ambulate 550 M during . Baseline: 504 M 01/01/24 Goal status: IN PROGRESS 01/01/24  Patient to demonstrate STS 10x within full ROM and good eccentric control.  Baseline: not standing fully and limited eccentric control Goal status: IN PROGRESS  Patient to report return to water aerobics without fatigue limiting.  Baseline: notes fatigue Goal status: IN PROGRESS   ASSESSMENT:  CLINICAL IMPRESSION: Pt presents today with no new complaints. Skilled PT session focused on flexibility, aerobic warm up and BLE strengthening.  Able to perform standing exercises with 3# weights today. Pt notes fatigue in BLEs, but otherwise does a nice job of strengthening exercises.  With dynamic balance/strengthening, she does have some Trendelenburg pattern in hips, able to improve some with tactile cues. Pt will continue to benefit from skilled PT towards goals for improved functional mobility and decreased fall risk.    OBJECTIVE IMPAIRMENTS:  Abnormal gait, decreased activity tolerance, decreased coordination, decreased endurance, difficulty walking, decreased strength, postural dysfunction, and pain.   ACTIVITY LIMITATIONS: carrying, lifting, bending, standing, squatting, stairs, transfers, and locomotion level  PARTICIPATION LIMITATIONS: meal prep, cleaning, laundry, shopping, community activity, occupation, yard work, and church  PERSONAL FACTORS: Age, Past/current experiences, Time since onset of injury/illness/exacerbation, and 1-2 comorbidities: HTN, HLD are also affecting patient's functional outcome.   REHAB POTENTIAL: Good  CLINICAL DECISION MAKING: Evolving/moderate complexity  EVALUATION COMPLEXITY: Moderate  PLAN:  PT FREQUENCY: 1-2x/week  PT DURATION: 6 weeks  PLANNED INTERVENTIONS: 97164- PT Re-evaluation, 97750- Physical Performance Testing, 97110-Therapeutic exercises, 97530- Therapeutic activity, [id number]- Neuromuscular re-education, 97535- Self Care, 02859- Manual therapy, [id number]- Gait training, [phone]- Canalith repositioning, [id number]- Aquatic Therapy, [phone]- Electrical stimulation (manual), [phone] (1-2 muscles), 20561 (3+ muscles)- Dry Needling, Patient/Family education, Balance training, Stair training, Taping, Joint mobilization, Spinal mobilization, Vestibular training, DME instructions, Cryotherapy, and Moist heat  PLAN FOR NEXT SESSION: Continue to progress  HEP for RLE strengthening and endurance; R glut activation, RLE SLS activities.  Consider interval training to improve activity tolerance.  Consider stretching as part of her HEP and ask if she has looked into [facility] fitness options.   Greig Anon, PT 01/10/24 8:47 AM Phone: [phone] Fax: [phone]   [facility] at [facility] [address] [place], [address] [place], [place] [address] Phone # [phone] Fax # [phone]

## 2024-01-12 DIAGNOSIS — E7800 Pure hypercholesterolemia, unspecified: Secondary | ICD-10-CM | POA: Diagnosis not present

## 2024-01-16 ENCOUNTER — Ambulatory Visit: Admitting: Occupational Therapy

## 2024-01-16 DIAGNOSIS — M6281 Muscle weakness (generalized): Secondary | ICD-10-CM

## 2024-01-16 DIAGNOSIS — R208 Other disturbances of skin sensation: Secondary | ICD-10-CM

## 2024-01-16 DIAGNOSIS — M25511 Pain in right shoulder: Secondary | ICD-10-CM | POA: Diagnosis not present

## 2024-01-16 DIAGNOSIS — G8929 Other chronic pain: Secondary | ICD-10-CM | POA: Diagnosis not present

## 2024-01-16 DIAGNOSIS — R278 Other lack of coordination: Secondary | ICD-10-CM | POA: Diagnosis not present

## 2024-01-16 DIAGNOSIS — R2689 Other abnormalities of gait and mobility: Secondary | ICD-10-CM | POA: Diagnosis not present

## 2024-01-16 DIAGNOSIS — R29818 Other symptoms and signs involving the nervous system: Secondary | ICD-10-CM | POA: Diagnosis not present

## 2024-01-16 NOTE — Patient Instructions (Addendum)
 Re/desensitization Techniques Some ways to Re/desensitize a hyper or hyposensitive ie) numb, tingling limb/area is by rubbing it with different textures. This will make your limb more tolerant/aware of touch and pressure. Before you begin, make sure your hands and the materials you're using are clean.  To rub your painful/sensitive area with different textures: Sit in a comfortable position with the numb/painful/sensitive area uncovered. Start with a material that is soft, such as a cotton ball, silk or soft towel. Rub your painful/sensitive area in all directions. Start with a light pressure and gradually increase the pressure as tolerated.  Vary the textures you use, as you can tolerate them. Start with soft materials like cotton balls or a makeup brush. Progress to materials that are rougher. Examples include a paper towel, cloth towel, wool, or velcro. As you progress, gradually increase the pressure and roughness of the texture you use. Be careful not to rub over any incisions or wounds, if you still have staples or sutures in place, or if there are any open areas. Rub your limb for at least 30 seconds progressing up to a minute or two as often as you can tolerate, or as recommended by your healthcare provider. Be careful not to irritate your skin or rub your skin raw.  Stop rubbing your affected area if you notice redness that does not dissipate in 15-30 minutes, bleeding or opening skin, and contact your healthcare provider.   Other methods for re/desensitization include: Allow cool or warm water to run over the area.  Be careful not to get the water too hot, especially if you have decreases sensation of temperature. Putting your affected area into a bowl of dry rice, sand, kidney beans, cold water or warm water. You can hide objects in the dry materials to find. Make a bag of miscellaneous matching objects to feel and match based on feel ie) paper clips, nuts, bolts, dice, marbles, checkers,  dominos, beads etc.   If you choose to start with the method of dipping your affected area into a medium such as rice, sand, or water make sure to move the affected area around in the bowl until you cannot tolerate it or you reach one to two minutes, whichever comes first. You can use a combination of these methods for 10 to 15 minutes, 3-4 times per day.    Safety considerations for loss of sensation:    Look at affected hand when using it!    Do NOT use affected arm for anything: sharp, hot, breakable, or too heavy   Always check temperature of water (for showering, washing dishes, etc) with UNaffected arm/extremity   Consider travel mugs w/ lids to transport hot liquids/coffee   Consider alternative options and/or adaptive equipment to make things safer (ex: hand chopper or cut resistant glove for chopping vegetables)    Avoid cold temperatures as well (wear glove in cold temperatures, get ice w/ unaffected extremity)   AVOID handling chemicals and machinery

## 2024-01-16 NOTE — Therapy (Signed)
 OUTPATIENT OCCUPATIONAL THERAPY NEURO Treatment Note  Patient Name: Veronica Webb MRN: [id number] DOB:05/09/1940, 83 y.o., female Today's Date: 01/16/2024  PCP: Dwight Trula SQUIBB, MD REFERRING PROVIDER: Whitfield Raisin, NP  END OF SESSION:  OT End of Session - 01/16/24 0808     Visit Number 2    Number of Visits 7   including eval   Date for Recertification  02/23/24    Authorization Type MCR A&B    OT Start Time 0802    OT Stop Time 0844    OT Time Calculation (min) 42 min    Activity Tolerance Patient tolerated treatment well    Behavior During Therapy [facility] for tasks assessed/performed           Past Medical History:  Diagnosis Date   Hypothyroidism    Past Surgical History:  Procedure Laterality Date   ABDOMINAL HYSTERECTOMY  1985   ovaries removed   COLONOSCOPY     COLONOSCOPY WITH PROPOFOL  N/A 01/29/2013   Procedure: COLONOSCOPY WITH PROPOFOL ;  Surgeon: Gladis MARLA Louder, MD;  Location: [facility];  Service: Endoscopy;  Laterality: N/A;   EYE SURGERY     cataract   Patient Active Problem List   Diagnosis Date Noted   Acute ischemic stroke (HCC) 09/21/2022    ONSET DATE: 12/21/23 referral date, 09/21/2022 stroke date (was hospitalized 07/24-07/26/2024)  REFERRING DIAG: M25.511,G89.29 (ICD-10-CM) - Chronic right shoulder pain Add'l note from referring provider: Post stroke right shoulder pain and decreased R hand dexterity  THERAPY DIAG:  Other lack of coordination  Muscle weakness (generalized)  Chronic right shoulder pain  Other disturbances of skin sensation  Rationale for Evaluation and Treatment: Rehabilitation  SUBJECTIVE:   SUBJECTIVE STATEMENT: Pt reporting that she had some cramping in her leg overnight that woke her up.  Pt reports folding up bathrobe and putting under knee, aiding in sleep.  Pt accompanied by: self  PERTINENT HISTORY: acute ischemic stroke, hypothyroidism, cataract surgery, macular pin  PRECAUTIONS: None  WEIGHT BEARING  RESTRICTIONS: No  PAIN:  Are you having pain? Yes: NPRS scale: 2/10 Pain location: R hand into forearm and upper arm Pain description: nagging, tickling sensation Aggravating factors: excessive movement Relieving factors: rest   FALLS: Has patient fallen in last 6 months? No  LIVING ENVIRONMENT: Lives with: lives with their spouse Lives in: House/apartment [facility] Stairs: No Has following equipment at home: Tour manager, Grab bars, and walking stick  PLOF: Independent and Leisure: was doing water aerobics and walking  PATIENT GOALS: I would like to not have this tingling.   OBJECTIVE:  Note: Objective measures were completed at Evaluation unless otherwise noted.  HAND DOMINANCE: Right  ADLs: Overall ADLs: Independent  IADLs: Independent with all IADL, does not cook but can if needed. Gets meals at ILF   MOBILITY STATUS: Independent  POSTURE COMMENTS:  No Significant postural limitations   ACTIVITY TOLERANCE: Activity tolerance: no concerns noted at this time  FUNCTIONAL OUTCOME MEASURES: Upper Extremity Functional Scale (UEFS): 73/80  UPPER EXTREMITY ROM:  WNL  Active ROM Right eval Left eval  Shoulder flexion    Shoulder abduction    Shoulder adduction    Shoulder extension    Shoulder internal rotation    Shoulder external rotation    Elbow flexion    Elbow extension    Wrist flexion    Wrist extension    Wrist ulnar deviation    Wrist radial deviation    Wrist pronation    Wrist supination    (  Blank rows = not tested)  UPPER EXTREMITY MMT:   4/5 grossly   HAND FUNCTION: Grip strength: Right: 30 lbs; Left: 25.67 lbs  COORDINATION: 9 Hole Peg test: Right: 23.70 sec; Left: 22.59 sec Box and Blocks:  Right 50 blocks, Left 55blocks  SENSATION:Reports tingling in RUE (specifically the hand) and that it travels to the L sometimes   COGNITION: Overall cognitive status: Within functional limits for tasks assessed  VISION: Subjective  report: Pt reports that she had difficulty reading when she had the stroke, but that it is fine now. Baseline vision: Wears glasses all the time Visual history: cataracts, macular pinch  VISION ASSESSMENT: Not tested  Patient has difficulty with following activities due to following visual impairments: none  PERCEPTION: Not tested  PRAXIS: Not tested  OBSERVATIONS: Decreased dexterity/FM coordination in R hand, sensation concerns in R hand chiefly (with occasional sensation concerns traveling to LUE), occasional pain in RUE as well                                                                                                                              TREATMENT DATE:  01/16/24 Coordination:  OT instructing in various coordination tasks with household items.  Provided with HEP with pictures for carryover to home. Picking up various small items with R hand.  Pt reporting tingling in hand impacting ease. Stacking coins and unstacking Rotating coins in finger tips Picking up 5-10 coins 1 at a time and translating palm to fingertips to place into coin slot Rotating golf balls in palm of hand clockwise Sensation: OT educating on re-sensitization strategies and providing education on the sensory and motor pathways in the brain.  OT educating on use of vision to compensate for impaired sensation.  OT providing with handout      01/10/24  Pt educated in purpose of OT, POC, and goals. Pt in agreement with goals. Educated in sleep positioning and its relation to improving numbness/tingling in arms/hands. See Pt instructions for detailed handout provided.      PATIENT EDUCATION: Education details: Patent Attorney and re-sensitization Person educated: Patient Education method: Explanation, Demonstration, and Handouts Education comprehension: verbalized understanding, returned demonstration, and needs further education  HOME EXERCISE PROGRAM:    GOALS: Goals reviewed with  patient? Yes  SHORT TERM GOALS: Target date: 02/02/24  Pt will be independent with HEPs for UE strength and coordination Baseline: New to OP OT Goal status:  in progress  2.  Pt will be educated in AE to improve ability to complete tasks such as opening jars Baseline: New to OP OT Goal status: in progress  3.  Pt will be educated in modality use to reduce pain prn Baseline: New to OP OT Goal status:  in progress    LONG TERM GOALS: Target date: 02/23/24  Pt will demonstrate improved function by a score of at least 75/80 on UEFS Baseline: 73/80 Goal status:  in progress  2.  Pt  will be able to place at least 55 blocks using right hand with completion of Box and Blocks test.  Baseline:  Right 50 blocks, Left 55blocks Goal status:  in progress  3.  Patient will demo improved FM coordination as evidenced by completing nine-hole peg with use of R hand in 21 seconds or less.  Baseline: Right: 23.70 sec; Left: 22.59 sec Goal status:  in progress   ASSESSMENT:  CLINICAL IMPRESSION: Patient is a 83 y.o. female who was seen today for occupational therapy treatment for RUE weakness and pain. Hx includes acute ischemic stroke, cataract surgery, hypothyroidism. Pt tolerating coordination tasks, demonstrating increased difficulty with pen tricks and in-hand manipulation with coins.  Pt reporting understanding of re-sensitization strategies and compensatory strategies for impaired sensation. Pt will continue to benefit from skilled OT services in the outpatient setting to work on impairments as noted below to help pt return to PLOF as able.     PERFORMANCE DEFICITS: in functional skills including IADLs, coordination, dexterity, sensation, pain, Fine motor control, and UE functional use, cognitive skills including safety awareness, and psychosocial skills including coping strategies and environmental adaptation.    PLAN:  OT FREQUENCY: 1x/week  OT DURATION: 6 weeks  PLANNED  INTERVENTIONS: 97168 OT Re-evaluation, 97535 self care/ADL training, 02889 therapeutic exercise, 97530 therapeutic activity, 97112 neuromuscular re-education, 97140 manual therapy, passive range of motion, energy conservation, coping strategies training, patient/family education, and DME and/or AE instructions  RECOMMENDED OTHER SERVICES: none  CONSULTED AND AGREED WITH PLAN OF CARE: Patient  PLAN FOR NEXT SESSION: Coordination HEP F/u sleep positioning Coordination activities Sensation loss mgmt  KAYLENE DOMINO, OTR/L 01/16/2024, 10:41 AM  [facility] at [facility] [address] [place], [place] [address] Phone # [phone] Fax # [phone]

## 2024-01-29 ENCOUNTER — Ambulatory Visit: Admitting: Physical Therapy

## 2024-01-30 ENCOUNTER — Ambulatory Visit: Attending: Adult Health | Admitting: Physical Therapy

## 2024-01-30 ENCOUNTER — Ambulatory Visit: Admitting: Occupational Therapy

## 2024-01-30 ENCOUNTER — Encounter: Payer: Self-pay | Admitting: Physical Therapy

## 2024-01-30 DIAGNOSIS — R278 Other lack of coordination: Secondary | ICD-10-CM | POA: Insufficient documentation

## 2024-01-30 DIAGNOSIS — R208 Other disturbances of skin sensation: Secondary | ICD-10-CM | POA: Diagnosis present

## 2024-01-30 DIAGNOSIS — M25511 Pain in right shoulder: Secondary | ICD-10-CM | POA: Insufficient documentation

## 2024-01-30 DIAGNOSIS — M6281 Muscle weakness (generalized): Secondary | ICD-10-CM | POA: Insufficient documentation

## 2024-01-30 DIAGNOSIS — R2689 Other abnormalities of gait and mobility: Secondary | ICD-10-CM | POA: Insufficient documentation

## 2024-01-30 DIAGNOSIS — G8929 Other chronic pain: Secondary | ICD-10-CM

## 2024-01-30 NOTE — Therapy (Signed)
 OUTPATIENT PHYSICAL THERAPY NEURO TREATMENT   Patient Name: Veronica Webb MRN: [id number] DOB:Sep 07, 1940, 83 y.o., female Today's Date: 01/30/2024   PCP: Dwight Trula SQUIBB, MD  REFERRING PROVIDER:    Whitfield Raisin, NP    END OF SESSION:       Past Medical History:  Diagnosis Date   Hypothyroidism    Past Surgical History:  Procedure Laterality Date   ABDOMINAL HYSTERECTOMY  1985   ovaries removed   COLONOSCOPY     COLONOSCOPY WITH PROPOFOL  N/A 01/29/2013   Procedure: COLONOSCOPY WITH PROPOFOL ;  Surgeon: Gladis MARLA Louder, MD;  Location: [facility];  Service: Endoscopy;  Laterality: N/A;   EYE SURGERY     cataract   Patient Active Problem List   Diagnosis Date Noted   Acute ischemic stroke (HCC) 09/21/2022    ONSET DATE: CVA  09/21/2022  REFERRING DIAG: P36.467 (ICD-10-CM) - 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  Rationale for Evaluation and Treatment: Rehabilitation  SUBJECTIVE:                                                                                                                                                                                             SUBJECTIVE STATEMENT: Have been traveling to [place] .  No travelling.  Went to the Flex and Flow class-it was hard to do because of stiffness.  Pt accompanied by: self  PERTINENT HISTORY: HLD, HTN  PAIN:  Are you having pain? No Always take Tylenol  PRECAUTIONS: None  RED FLAGS: None   WEIGHT BEARING RESTRICTIONS: No  FALLS: Has patient fallen in last 6 months? No  LIVING ENVIRONMENT: Lives with: lives with their spouse Lives in: House/apartment [facility] Stairs: none Has following equipment at home: Shower bench, Grab bars, and walking stick  PLOF: Independent and Leisure: was previously doing water aerobics  but is having a hard time doing this now, walking   PATIENT GOALS: I would like to eliminate the pain   OBJECTIVE:    TODAY'S TREATMENT: 01/30/2024 Activity Comments  NuStep, Level, 4, 4 extremities, x 8 minutes For aerobic warm up  Stretching for BLEs: -Seated hamstring stretch, 3 x 30 sec -Standing gastroc stretch 3 x 30 sec -Standing L hip flexor/gastroc/low back/hamstring stretch, 5 x 10 sec   Dynamic stretch: Stagger stance rock forward and back x 15 reps Wide BOS lateral weightshift with reaching x 10 reps   Forward/back walking x 25 ft, 3 reps Forward high knee march 25 ft x 4 reps Heel walking/toe walking in  parallel bars Difficulty with SLS and posterior step on RLE  -SLS with RLE as stance, LLE step taps to 6 step, 12 step; -Alternating step taps -RLE single leg step ups to 6 step, 2 x 10 reps RLE fatigues Intermittent/light UE support            HOME EXERCISE PROGRAM  Access Code: [email or web address] URL: [email or web address] Date: 01/10/2024 Prepared by: [facility] - Outpatient  Rehab - [facility]  Exercises - Sit to Stand with Arms Crossed  - 1 x daily - 5 x weekly - 2 sets - 30 sec hold - Standing March with Counter Support  - 1 x daily - 5 x weekly - 2 sets - 30 sec hold - Standing Hip Extension with Counter Support  - 1 x daily - 5 x weekly - 2 sets - 30 sec hold - Side Stepping with Counter Support  - 1 x daily - 5 x weekly - 2 sets - 60 sec hold - Sit to stand in stride stance  - 1 x daily - 5 x weekly - 3 sets - 5 reps - Alternating Step Taps with Counter Support  - 1 x daily - 5 x weekly - 3 sets - 10 reps - Heel Toe Raises with Counter Support  - 1 x daily - 7 x weekly - 3 sets - 10 reps     PATIENT EDUCATION: Education details: Benefits to have regular stretching as part of HEP Person educated: Patient Education method: Explanation, Demonstration, and Verbal cues Education comprehension: verbalized understanding and returned  demonstration    Note: Objective measures were completed at Evaluation unless otherwise noted.  DIAGNOSTIC FINDINGS: none recent  COGNITION: Overall cognitive status: Within functional limits for tasks assessed   SENSATION: WNL to light tough in UEs and LEs   COORDINATION: Alternating pronation/supination: WNL B Alternating toe tap: WNL B Finger to nose: slight dysmetria R   MUSCLE TONE: WNL B LEs  POSTURE: rounded shoulders, flexed trunk  Palpation: c/o TTP and slightly tight in the R proximal biceps tendon   LOWER EXTREMITY ROM:     Active  Right Eval Left Eval  Hip flexion    Hip extension    Hip abduction    Hip adduction    Hip internal rotation    Hip external rotation    Knee flexion    Knee extension 12 14  Ankle dorsiflexion    Ankle plantarflexion    Ankle inversion    Ankle eversion     (Blank rows = not tested)  LOWER EXTREMITY MMT:    MMT Right Eval Left Eval  Hip flexion 3 3  Hip extension    Hip abduction 4 4  Hip adduction 4+ 4+  Hip internal rotation    Hip external rotation    Knee flexion 3+ 3+  Knee extension 4+ 4+  Ankle dorsiflexion 4+ 4+  Ankle plantarflexion Unable to perform without compensations in standing Unable to perform without compensations in standing  Ankle inversion    Ankle eversion    (Blank rows = not tested)  GAIT: Findings: Assistive device utilized:None, Level of assistance: Complete Independence, and Comments: slight L hip drop, slight trunk flexion    FUNCTIONAL TESTS:  5 times sit to stand: 13.29 sec with cues to stand up fully, limited eccentric control and c/o L knee pain  10 meter walk test: 8.16 sec (4.02 ft/sec)  TREATMENT DATE: 12/21/23    PATIENT EDUCATION: Education details: prognosis, POC, exam findings as they relate to patient's impairments, edu on benefits of OT for writing, hand dexterity,  shoulder pain- pt agreeable Person educated: Patient Education method: Explanation, Demonstration, Tactile cues, Verbal cues, and Handouts Education comprehension: verbalized understanding and returned demonstration  HOME EXERCISE PROGRAM: Not yet initiated  GOALS: Goals reviewed with patient? Yes  SHORT TERM GOALS: Target date: 01/11/2024  Patient to be independent with initial HEP. Baseline: HEP initiated 01/01/24 Goal status: MET    LONG TERM GOALS: Target date: 02/15/2024  Patient to be independent with advanced HEP. Baseline: Not yet initiated  Goal status: IN PROGRESS  Patient to demonstrate B LE strength >/=4+/5.  Baseline: See above Goal status: IN PROGRESS  Patient to ambulate 550 M during . Baseline: 504 M 01/01/24 Goal status: IN PROGRESS 01/01/24  Patient to demonstrate STS 10x within full ROM and good eccentric control.  Baseline: not standing fully and limited eccentric control Goal status: IN PROGRESS  Patient to report return to water aerobics without fatigue limiting.  Baseline: notes fatigue Goal status: IN PROGRESS   ASSESSMENT:  CLINICAL IMPRESSION: Pt presents today after being out of town for extended time for the holiday. Skilled PT session focused on static and dynamic stretching as well as single limb stance. Pt needs UE support for RLE SLS and notes that RLE fatigues faster with these exercises. Pt will continue to benefit from skilled PT towards goals for improved functional mobility and decreased fall risk.    OBJECTIVE IMPAIRMENTS: Abnormal gait, decreased activity tolerance, decreased coordination, decreased endurance, difficulty walking, decreased strength, postural dysfunction, and pain.   ACTIVITY LIMITATIONS: carrying, lifting, bending, standing, squatting, stairs, transfers, and locomotion level  PARTICIPATION LIMITATIONS: meal prep, cleaning, laundry, shopping, community activity, occupation, yard work, and church  PERSONAL  FACTORS: Age, Past/current experiences, Time since onset of injury/illness/exacerbation, and 1-2 comorbidities: HTN, HLD are also affecting patient's functional outcome.   REHAB POTENTIAL: Good  CLINICAL DECISION MAKING: Evolving/moderate complexity  EVALUATION COMPLEXITY: Moderate  PLAN:  PT FREQUENCY: 1-2x/week  PT DURATION: 6 weeks  PLANNED INTERVENTIONS: 97164- PT Re-evaluation, 97750- Physical Performance Testing, 97110-Therapeutic exercises, 97530- Therapeutic activity, [id number]- Neuromuscular re-education, 97535- Self Care, 02859- Manual therapy, [id number]- Gait training, [phone]- Canalith repositioning, [id number]- Aquatic Therapy, [phone]- Electrical stimulation (manual), [phone] (1-2 muscles), 20561 (3+ muscles)- Dry Needling, Patient/Family education, Balance training, Stair training, Taping, Joint mobilization, Spinal mobilization, Vestibular training, DME instructions, Cryotherapy, and Moist heat  PLAN FOR NEXT SESSION: Continue to progress HEP for RLE strengthening and endurance; R glut activation, RLE SLS activities.  Consider interval training to improve activity tolerance.  Add some BLE stretching as part of her HEP and ask if she has looked into [facility] fitness options.   Greig Anon, PT 01/30/24 7:55 AM Phone: [phone] Fax: [phone]   [facility] Out[facility] Re[facility] at [facility] [address] [place], [address] [place], [place] [address] Phone # [phone] Fax # [phone]

## 2024-01-30 NOTE — Therapy (Signed)
 OUTPATIENT OCCUPATIONAL THERAPY NEURO Treatment Note  Patient Name: Veronica Webb MRN: [id number] DOB:02-23-1941, 83 y.o., female Today's Date: 01/30/2024  PCP: Dwight Trula SQUIBB, MD REFERRING PROVIDER: Whitfield Raisin, NP  END OF SESSION:  OT End of Session - 01/30/24 0849     Visit Number 3    Number of Visits 7   including eval   Date for Recertification  02/23/24    Authorization Type MCR A&B    OT Start Time 0848    OT Stop Time 0931    OT Time Calculation (min) 43 min    Activity Tolerance Patient tolerated treatment well    Behavior During Therapy [facility] for tasks assessed/performed            Past Medical History:  Diagnosis Date   Hypothyroidism    Past Surgical History:  Procedure Laterality Date   ABDOMINAL HYSTERECTOMY  1985   ovaries removed   COLONOSCOPY     COLONOSCOPY WITH PROPOFOL  N/A 01/29/2013   Procedure: COLONOSCOPY WITH PROPOFOL ;  Surgeon: Gladis MARLA Louder, MD;  Location: [facility];  Service: Endoscopy;  Laterality: N/A;   EYE SURGERY     cataract   Patient Active Problem List   Diagnosis Date Noted   Acute ischemic stroke (HCC) 09/21/2022    ONSET DATE: 12/21/23 referral date, 09/21/2022 stroke date (was hospitalized 07/24-07/26/2024)  REFERRING DIAG: M25.511,G89.29 (ICD-10-CM) - Chronic right shoulder pain Add'l note from referring provider: Post stroke right shoulder pain and decreased R hand dexterity  THERAPY DIAG:  Other lack of coordination  Chronic right shoulder pain  Other disturbances of skin sensation  Rationale for Evaluation and Treatment: Rehabilitation  SUBJECTIVE:   SUBJECTIVE STATEMENT: Pt reporting that the radial side of her left hand into forearm felt like it was on fire two nights ago.    Pt accompanied by: self  PERTINENT HISTORY: acute ischemic stroke, hypothyroidism, cataract surgery, macular pin  PRECAUTIONS: None  WEIGHT BEARING RESTRICTIONS: No  PAIN:  Are you having pain? Yes: NPRS scale: 2/10 Pain  location: R hand into forearm and upper arm Pain description: nagging, tickling sensation Aggravating factors: excessive movement Relieving factors: rest   FALLS: Has patient fallen in last 6 months? No  LIVING ENVIRONMENT: Lives with: lives with their spouse Lives in: House/apartment [facility] Stairs: No Has following equipment at home: Tour manager, Grab bars, and walking stick  PLOF: Independent and Leisure: was doing water aerobics and walking  PATIENT GOALS: I would like to not have this tingling.   OBJECTIVE:  Note: Objective measures were completed at Evaluation unless otherwise noted.  HAND DOMINANCE: Right  ADLs: Overall ADLs: Independent  IADLs: Independent with all IADL, does not cook but can if needed. Gets meals at ILF   MOBILITY STATUS: Independent  POSTURE COMMENTS:  No Significant postural limitations   ACTIVITY TOLERANCE: Activity tolerance: no concerns noted at this time  FUNCTIONAL OUTCOME MEASURES: Upper Extremity Functional Scale (UEFS): 73/80  UPPER EXTREMITY ROM:  WNL  Active ROM Right eval Left eval  Shoulder flexion    Shoulder abduction    Shoulder adduction    Shoulder extension    Shoulder internal rotation    Shoulder external rotation    Elbow flexion    Elbow extension    Wrist flexion    Wrist extension    Wrist ulnar deviation    Wrist radial deviation    Wrist pronation    Wrist supination    (Blank rows = not tested)  UPPER EXTREMITY MMT:   4/5 grossly   HAND FUNCTION: Grip strength: Right: 30 lbs; Left: 25.67 lbs  COORDINATION: 9 Hole Peg test: Right: 23.70 sec; Left: 22.59 sec Box and Blocks:  Right 50 blocks, Left 55blocks  SENSATION:Reports tingling in RUE (specifically the hand) and that it travels to the L sometimes   COGNITION: Overall cognitive status: Within functional limits for tasks assessed  VISION: Subjective report: Pt reports that she had difficulty reading when she had the stroke,  but that it is fine now. Baseline vision: Wears glasses all the time Visual history: cataracts, macular pinch  VISION ASSESSMENT: Not tested  Patient has difficulty with following activities due to following visual impairments: none  PERCEPTION: Not tested  PRAXIS: Not tested  OBSERVATIONS: Decreased dexterity/FM coordination in R hand, sensation concerns in R hand chiefly (with occasional sensation concerns traveling to LUE), occasional pain in RUE as well                                                                                                                              TREATMENT DATE:  01/30/24 Clothespins: Resistance Clothespins 2,4,6# with RUE for high functional reaching (shoulder flexion) and sustained pinch. Pt demonstrating good flexion overhead, however reporting fatigue with pinch and soreness in biceps after repetitive reaching.  Shoulder ROM: attempted shoulder ROM with yellow theraband with pt tolerating shoulder flexion, however reporting pain with attempts at abduction.  OT downgraded task to have pt complete ROM in supine to decrease effects of gravity on movement.  Engaged in shoulder flexion, abduction, and internal/external rotation in supine.  OT providing education on technique and positioning to increase ROM and ease.  Pt completed each x10.  Provided with handout.    01/16/24 Coordination:  OT instructing in various coordination tasks with household items.  Provided with HEP with pictures for carryover to home. Picking up various small items with R hand.  Pt reporting tingling in hand impacting ease. Stacking coins and unstacking Rotating coins in finger tips Picking up 5-10 coins 1 at a time and translating palm to fingertips to place into coin slot Rotating golf balls in palm of hand clockwise Sensation: OT educating on re-sensitization strategies and providing education on the sensory and motor pathways in the brain.  OT educating on use of vision to  compensate for impaired sensation.  OT providing with handout      01/10/24  Pt educated in purpose of OT, POC, and goals. Pt in agreement with goals. Educated in sleep positioning and its relation to improving numbness/tingling in arms/hands. See Pt instructions for detailed handout provided.      PATIENT EDUCATION: Education details: Patent Attorney and re-sensitization Person educated: Patient Education method: Explanation, Demonstration, and Handouts Education comprehension: verbalized understanding, returned demonstration, and needs further education  HOME EXERCISE PROGRAM: Access Code: [email or web address] URL: [email or web address] Date: 01/30/2024 Prepared by: [facility] - Outpatient  Rehab - [facility]  Exercises - Seated Shoulder  Blade Squeeze   - Seated Shoulder Rolls   - Supine Shoulder Flexion Extension Full Range AROM  - Shoulder Abduction Adduction   - Single Arm Shoulder External Rotation     GOALS: Goals reviewed with patient? Yes  SHORT TERM GOALS: Target date: 02/02/24  Pt will be independent with HEPs for UE strength and coordination Baseline: New to OP OT Goal status:  in progress  2.  Pt will be educated in AE to improve ability to complete tasks such as opening jars Baseline: New to OP OT Goal status: in progress  3.  Pt will be educated in modality use to reduce pain prn Baseline: New to OP OT Goal status:  in progress    LONG TERM GOALS: Target date: 02/23/24  Pt will demonstrate improved function by a score of at least 75/80 on UEFS Baseline: 73/80 Goal status:  in progress  2.  Pt will be able to place at least 55 blocks using right hand with completion of Box and Blocks test.  Baseline:  Right 50 blocks, Left 55blocks Goal status:  in progress  3.  Patient will demo improved FM coordination as evidenced by completing nine-hole peg with use of R hand in 21 seconds or less.  Baseline: Right: 23.70 sec; Left: 22.59  sec Goal status:  in progress   ASSESSMENT:  CLINICAL IMPRESSION: Patient is a 83 y.o. female who was seen today for occupational therapy treatment for RUE weakness and pain. Hx includes acute ischemic stroke, cataract surgery, hypothyroidism. Pt verbalizing pain in R upper arm with attempts at abduction against yellow theraband, therefore transitioned to supine with pt tolerating increased ROM.  Pt stating that abduction still a bit uncomfortable but with cues for technique pt with improved tolerance.  Pt reporting understanding of positioning for improved ROM and carryover to functional tasks.  Pt will continue to benefit from skilled OT services in the outpatient setting to work on impairments as noted below to help pt return to PLOF as able.     PERFORMANCE DEFICITS: in functional skills including IADLs, coordination, dexterity, sensation, pain, Fine motor control, and UE functional use, cognitive skills including safety awareness, and psychosocial skills including coping strategies and environmental adaptation.    PLAN:  OT FREQUENCY: 1x/week  OT DURATION: 6 weeks  PLANNED INTERVENTIONS: 97168 OT Re-evaluation, 97535 self care/ADL training, 02889 therapeutic exercise, 97530 therapeutic activity, 97112 neuromuscular re-education, 97140 manual therapy, passive range of motion, energy conservation, coping strategies training, patient/family education, and DME and/or AE instructions  RECOMMENDED OTHER SERVICES: none  CONSULTED AND AGREED WITH PLAN OF CARE: Patient  PLAN FOR NEXT SESSION: Coordination HEP Review shoulder HEP F/u sleep positioning Coordination activities Sensation loss junita KAYLENE DOMINO, OTR/L 01/30/2024, 9:43 AM  [facility] at [facility] [address], [address] [place], [place] [address] Phone # [phone] Fax # [phone]

## 2024-01-31 ENCOUNTER — Ambulatory Visit

## 2024-02-01 ENCOUNTER — Ambulatory Visit: Admitting: Physical Therapy

## 2024-02-01 ENCOUNTER — Encounter: Payer: Self-pay | Admitting: Physical Therapy

## 2024-02-01 DIAGNOSIS — M6281 Muscle weakness (generalized): Secondary | ICD-10-CM

## 2024-02-01 DIAGNOSIS — R2689 Other abnormalities of gait and mobility: Secondary | ICD-10-CM

## 2024-02-01 NOTE — Therapy (Signed)
 OUTPATIENT PHYSICAL THERAPY NEURO TREATMENT   Patient Name: Veronica Webb MRN: [id number] DOB:1940/11/20, 83 y.o., female Today's Date: 02/01/2024   PCP: Dwight Trula SQUIBB, MD  REFERRING PROVIDER:    Whitfield Raisin, NP    END OF SESSION:  PT End of Session - 02/01/24 0800     Visit Number 7    Number of Visits 13    Date for Recertification  02/15/24    Authorization Type Medicare/BCBS    PT Start Time 0803    PT Stop Time 0841    PT Time Calculation (min) 38 min    Equipment Utilized During Treatment Gait belt    Activity Tolerance Patient tolerated treatment well    Behavior During Therapy WFL for tasks assessed/performed              Past Medical History:  Diagnosis Date   Hypothyroidism    Past Surgical History:  Procedure Laterality Date   ABDOMINAL HYSTERECTOMY  1985   ovaries removed   COLONOSCOPY     COLONOSCOPY WITH PROPOFOL  N/A 01/29/2013   Procedure: COLONOSCOPY WITH PROPOFOL ;  Surgeon: Gladis MARLA Louder, MD;  Location: [facility];  Service: Endoscopy;  Laterality: N/A;   EYE SURGERY     cataract   Patient Active Problem List   Diagnosis Date Noted   Acute ischemic stroke (HCC) 09/21/2022    ONSET DATE: CVA  09/21/2022  REFERRING DIAG: P36.467 (ICD-10-CM) - 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  They have a machine similar to Nustep at [facility], but I haven't used it.  Pt accompanied by: self  PERTINENT HISTORY: HLD,  HTN  PAIN:  Are you having pain? No Always take Tylenol  PRECAUTIONS: None  RED FLAGS: None   WEIGHT BEARING RESTRICTIONS: No  FALLS: Has patient fallen in last 6 months? No  LIVING ENVIRONMENT: Lives with: lives with their spouse Lives in: House/apartment [facility] Stairs: none Has following equipment at home: Shower bench, Grab bars, and walking stick  PLOF: Independent and Leisure: was previously doing water aerobics but is having a hard time doing this now, walking   PATIENT GOALS: I would like to eliminate  the pain   OBJECTIVE:    TODAY'S TREATMENT: 02/01/2024 Activity Comments  NuStep, Level, 4, 4 extremities, x 8 minutes For aerobic warm up  Stretching for BLEs: -Seated hamstring stretch, 3 x 30 sec -Standing gastroc stretch 3 x 30 sec -Standing L hip flexor/gastroc/low back/hamstring stretch, 5 x 10 sec   Dynamic stretch: Stagger stance rock forward and back x 15 reps Wide BOS lateral weightshift with reaching x 10 reps   Forward/back walking x 25 ft, 3 reps Forward high knee march 25 ft x 4 reps Heel walking/toe walking-in gym area-difficulty without UE support Difficulty with SLS and posterior step on RLE  -Alternating step taps>progress to hover foot over step, for improved SLS -RLE single leg step ups to 6 step, 2 x 10 reps Cues for technique Intermittent/light UE support            HOME EXERCISE PROGRAM   Access Code: [email or web address] URL: [email or web address] Date: 02/01/2024 Prepared by: [facility] - Outpatient  Rehab - [facility]  Exercises - Sit to Stand with Arms Crossed  - 1 x daily - 5 x weekly - 2 sets - 30 sec hold - Standing March with Counter Support  - 1 x daily - 5 x weekly - 2 sets - 30 sec hold - Standing Hip Extension with Counter Support  - 1 x daily - 5 x weekly - 2 sets - 30 sec hold - Side Stepping with Counter Support  - 1 x daily - 5 x weekly - 2 sets - 60 sec hold - Sit to stand in  stride stance  - 1 x daily - 5 x weekly - 3 sets - 5 reps - Alternating Step Taps with Counter Support  - 1 x daily - 5 x weekly - 3 sets - 10 reps - Heel Toe Raises with Counter Support  - 1 x daily - 7 x weekly - 3 sets - 10 reps - Standing posture stretch  - 1 x daily - 7 x weekly - 1 sets - 5 reps - Standing Gastroc Stretch at Counter  - 1 x daily - 7 x weekly - 1 sets - 3 reps - 30sec hold - Seated Hamstring Stretch  - 1 x daily - 7 x weekly - 1 sets - 3 reps - 30 sec hold - Staggered Stance Forward Backward Weight Shift with Counter Support  - 1 x daily - 7 x weekly - 2 sets - 10 reps    PATIENT EDUCATION: Education details: Updates to HEP and benefits to have regular stretching as part of HEP Person educated: Patient Education method: Explanation, Demonstration, and Verbal cues Education comprehension: verbalized understanding and returned demonstration    Note: Objective measures were completed at Evaluation unless otherwise noted.  DIAGNOSTIC FINDINGS: none recent  COGNITION: Overall cognitive status: Within functional limits for tasks assessed   SENSATION: WNL to light tough in UEs and LEs   COORDINATION: Alternating pronation/supination: WNL B Alternating toe tap: WNL B Finger to nose: slight dysmetria R   MUSCLE TONE: WNL B LEs  POSTURE: rounded shoulders, flexed trunk  Palpation: c/o TTP and slightly tight in the R proximal biceps tendon   LOWER EXTREMITY ROM:     Active  Right Eval Left Eval  Hip flexion    Hip extension    Hip abduction    Hip adduction    Hip internal rotation    Hip external rotation    Knee flexion    Knee extension  12 14  Ankle dorsiflexion    Ankle plantarflexion    Ankle inversion    Ankle eversion     (Blank rows = not tested)  LOWER EXTREMITY MMT:    MMT Right Eval Left Eval  Hip flexion 3 3  Hip extension    Hip abduction 4 4  Hip adduction 4+ 4+  Hip internal rotation    Hip external rotation    Knee  flexion 3+ 3+  Knee extension 4+ 4+  Ankle dorsiflexion 4+ 4+  Ankle plantarflexion Unable to perform without compensations in standing Unable to perform without compensations in standing  Ankle inversion    Ankle eversion    (Blank rows = not tested)  GAIT: Findings: Assistive device utilized:None, Level of assistance: Complete Independence, and Comments: slight L hip drop, slight trunk flexion    FUNCTIONAL TESTS:  5 times sit to stand: 13.29 sec with cues to stand up fully, limited eccentric control and c/o L knee pain  10 meter walk test: 8.16 sec (4.02 ft/sec)                                                                                                 TREATMENT DATE: 12/21/23    PATIENT EDUCATION: Education details: prognosis, POC, exam findings as they relate to patient's impairments, edu on benefits of OT for writing, hand dexterity, shoulder pain- pt agreeable Person educated: Patient Education method: Explanation, Demonstration, Tactile cues, Verbal cues, and Handouts Education comprehension: verbalized understanding and returned demonstration  HOME EXERCISE PROGRAM: Not yet initiated  GOALS: Goals reviewed with patient? Yes  SHORT TERM GOALS: Target date: 01/11/2024  Patient to be independent with initial HEP. Baseline: HEP initiated 01/01/24 Goal status: MET    LONG TERM GOALS: Target date: 02/15/2024  Patient to be independent with advanced HEP. Baseline: Not yet initiated  Goal status: IN PROGRESS  Patient to demonstrate B LE strength >/=4+/5.  Baseline: See above Goal status: IN PROGRESS  Patient to ambulate 550 M during . Baseline: 504 M 01/01/24 Goal status: IN PROGRESS 01/01/24  Patient to demonstrate STS 10x within full ROM and good eccentric control.  Baseline: not standing fully and limited eccentric control Goal status: IN PROGRESS  Patient to report return to water aerobics without fatigue limiting.  Baseline: notes fatigue Goal  status: IN PROGRESS   ASSESSMENT:  CLINICAL IMPRESSION: Pt presents today with no new complaints. Skilled PT session focused on adding stretching as part of pt's HEP.  Also worked on [employer] and dynamic balance, with pt having difficulty with RLE as stance leg and with backwards walking.  Tandem gait without support is very difficult.   Pt will continue to benefit from skilled PT towards goals for improved functional mobility and decreased fall risk.   OBJECTIVE IMPAIRMENTS: Abnormal gait, decreased activity tolerance, decreased coordination, decreased endurance, difficulty walking, decreased strength, postural dysfunction, and pain.   ACTIVITY LIMITATIONS: carrying, lifting, bending, standing, squatting, stairs, transfers, and locomotion level  PARTICIPATION LIMITATIONS: meal prep, cleaning, laundry, shopping, community activity, occupation, yard work, and church  PERSONAL FACTORS: Age, Past/current experiences, Time since  onset of injury/illness/exacerbation, and 1-2 comorbidities: HTN, HLD are also affecting patient's functional outcome.   REHAB POTENTIAL: Good  CLINICAL DECISION MAKING: Evolving/moderate complexity  EVALUATION COMPLEXITY: Moderate  PLAN:  PT FREQUENCY: 1-2x/week  PT DURATION: 6 weeks  PLANNED INTERVENTIONS: 97164- PT Re-evaluation, 97750- Physical Performance Testing, 97110-Therapeutic exercises, 97530- Therapeutic activity, [id number]- Neuromuscular re-education, 97535- Self Care, 02859- Manual therapy, [id number]- Gait training, [phone]- Canalith repositioning, [id number]- Aquatic Therapy, [phone]- Electrical stimulation (manual), [phone] (1-2 muscles), 20561 (3+ muscles)- Dry Needling, Patient/Family education, Balance training, Stair training, Taping, Joint mobilization, Spinal mobilization, Vestibular training, DME instructions, Cryotherapy, and Moist heat  PLAN FOR NEXT SESSION: Review stretches as needed.  Continue to progress HEP for RLE strengthening and endurance; R glut  activation, RLE SLS activities.  Consider interval training to improve activity tolerance.    Greig Anon, PT 02/01/24 8:06 AM Phone: [phone] Fax: [phone]   [facility] at [facility] [address] [place], [address] [place], [place] [address] Phone # [phone] Fax # [phone]

## 2024-02-05 ENCOUNTER — Encounter: Payer: Self-pay | Admitting: Physical Therapy

## 2024-02-05 ENCOUNTER — Ambulatory Visit: Admitting: Physical Therapy

## 2024-02-05 DIAGNOSIS — M6281 Muscle weakness (generalized): Secondary | ICD-10-CM | POA: Diagnosis not present

## 2024-02-05 DIAGNOSIS — R2689 Other abnormalities of gait and mobility: Secondary | ICD-10-CM

## 2024-02-05 NOTE — Therapy (Signed)
 OUTPATIENT PHYSICAL THERAPY NEURO TREATMENT   Patient Name: Veronica Webb MRN: [id number] DOB:17-Oct-1940, 83 y.o., female Today's Date: 02/05/2024   PCP: Dwight Trula SQUIBB, MD  REFERRING PROVIDER:    Whitfield Raisin, NP    END OF SESSION:  PT End of Session - 02/05/24 0758     Visit Number 8    Number of Visits 13    Date for Recertification  02/15/24    Authorization Type Medicare/BCBS    PT Start Time 0802    PT Stop Time 0840    PT Time Calculation (min) 38 min    Equipment Utilized During Treatment Gait belt    Activity Tolerance Patient tolerated treatment well    Behavior During Therapy WFL for tasks assessed/performed               Past Medical History:  Diagnosis Date   Hypothyroidism    Past Surgical History:  Procedure Laterality Date   ABDOMINAL HYSTERECTOMY  1985   ovaries removed   COLONOSCOPY     COLONOSCOPY WITH PROPOFOL  N/A 01/29/2013   Procedure: COLONOSCOPY WITH PROPOFOL ;  Surgeon: Gladis MARLA Louder, MD;  Location: [facility];  Service: Endoscopy;  Laterality: N/A;   EYE SURGERY     cataract   Patient Active Problem List   Diagnosis Date Noted   Acute ischemic stroke (HCC) 09/21/2022    ONSET DATE: CVA  09/21/2022  REFERRING DIAG: P36.467 (ICD-10-CM) - 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  Been doing the stretching.  The tingling is better.  I am sleeping through the night better.  Pt accompanied by: self  PERTINENT HISTORY: HLD,  HTN  PAIN:  Are you having pain? No Always take Tylenol  PRECAUTIONS: None  RED FLAGS: None   WEIGHT BEARING RESTRICTIONS: No  FALLS: Has patient fallen in last 6 months? No  LIVING ENVIRONMENT: Lives with: lives with their spouse Lives in: House/apartment [facility] Stairs: none Has following equipment at home: Shower bench, Grab bars, and walking stick  PLOF: Independent and Leisure: was previously doing water aerobics but is having a hard time doing this now, walking   PATIENT GOALS: I would  like to eliminate the pain   OBJECTIVE:        TODAY'S TREATMENT: 02/05/2024 Activity Comments  NuStep, Level, 3-4, 4 extremities, x 8 minutes For aerobic warm up  Sit to stand, RLE tucked posterior, from mat, then from 18 chair 10 reps each, good eccentric control More difficulty from 18 chair  Dynamic balance upon standing Stagger stance rock forward and back x 10 reps Wide BOS lateral weightshift with reaching x 10 reps   Forward/back walking x 25 ft, 3 reps Forward high knee march 25 ft x 4 reps Weighted carry, single arm and bilateral arms, 4# Difficulty with SLS with marching    Postural cues  -Sidestep squats 20 ft x 2 reps -Monster walk 20 ft x 2 Cues for technique   Forward step ups RLE leading 2 x 10 Intermittent UE support         HOME EXERCISE PROGRAM   Access Code: [email or web address] URL: [email or web address] Date: 02/01/2024 Prepared by: [facility] - Outpatient  Rehab - [facility]  Exercises - Sit to Stand with Arms Crossed  - 1 x daily - 5 x weekly - 2 sets - 30 sec hold - Standing March with Counter Support  - 1 x daily - 5 x weekly - 2 sets - 30 sec hold - Standing Hip Extension with Counter Support  - 1 x daily - 5 x weekly - 2 sets - 30 sec hold - Side Stepping with Counter Support  - 1 x daily - 5 x weekly - 2 sets - 60 sec hold - Sit to stand in stride stance  - 1 x daily - 5 x weekly - 3 sets - 5 reps -  Alternating Step Taps with Counter Support  - 1 x daily - 5 x weekly - 3 sets - 10 reps - Heel Toe Raises with Counter Support  - 1 x daily - 7 x weekly - 3 sets - 10 reps - Standing posture stretch  - 1 x daily - 7 x weekly - 1 sets - 5 reps - Standing Gastroc Stretch at Counter  - 1 x daily - 7 x weekly - 1 sets - 3 reps - 30sec hold - Seated Hamstring Stretch  - 1 x daily - 7 x weekly - 1 sets - 3 reps - 30 sec hold - Staggered Stance Forward Backward Weight Shift with Counter Support  - 1 x daily - 7 x weekly - 2 sets - 10 reps    PATIENT EDUCATION: Education details: Consider looking into fitness center at [facility]- do they have aerobic steps?  Consistent exercise activities every day (Flex and Flow class 2x/wk, walking, Nustep, etc) Person educated: Patient Education method: Explanation, Demonstration, and Verbal cues Education comprehension: verbalized understanding and returned demonstration    Note: Objective measures were completed at Evaluation unless otherwise noted.  DIAGNOSTIC FINDINGS: none recent  COGNITION: Overall cognitive status: Within functional limits for tasks assessed   SENSATION: WNL to light tough in UEs and LEs   COORDINATION: Alternating pronation/supination: WNL B Alternating toe tap: WNL B Finger to nose: slight dysmetria R   MUSCLE TONE: WNL B LEs  POSTURE: rounded shoulders, flexed trunk  Palpation: c/o TTP and slightly tight in the R proximal biceps tendon   LOWER EXTREMITY ROM:     Active  Right Eval Left Eval  Hip flexion    Hip extension    Hip abduction    Hip adduction    Hip internal rotation  Hip external rotation    Knee flexion    Knee extension 12 14  Ankle dorsiflexion    Ankle plantarflexion    Ankle inversion    Ankle eversion     (Blank rows = not tested)  LOWER EXTREMITY MMT:    MMT Right Eval Left Eval  Hip flexion 3 3  Hip extension    Hip abduction 4 4  Hip adduction 4+ 4+  Hip internal rotation     Hip external rotation    Knee flexion 3+ 3+  Knee extension 4+ 4+  Ankle dorsiflexion 4+ 4+  Ankle plantarflexion Unable to perform without compensations in standing Unable to perform without compensations in standing  Ankle inversion    Ankle eversion    (Blank rows = not tested)  GAIT: Findings: Assistive device utilized:None, Level of assistance: Complete Independence, and Comments: slight L hip drop, slight trunk flexion    FUNCTIONAL TESTS:  5 times sit to stand: 13.29 sec with cues to stand up fully, limited eccentric control and c/o L knee pain  10 meter walk test: 8.16 sec (4.02 ft/sec)                                                                                                 TREATMENT DATE: 12/21/23    PATIENT EDUCATION: Education details: prognosis, POC, exam findings as they relate to patient's impairments, edu on benefits of OT for writing, hand dexterity, shoulder pain- pt agreeable Person educated: Patient Education method: Explanation, Demonstration, Tactile cues, Verbal cues, and Handouts Education comprehension: verbalized understanding and returned demonstration  HOME EXERCISE PROGRAM: Not yet initiated  GOALS: Goals reviewed with patient? Yes  SHORT TERM GOALS: Target date: 01/11/2024  Patient to be independent with initial HEP. Baseline: HEP initiated 01/01/24 Goal status: MET    LONG TERM GOALS: Target date: 02/15/2024  Patient to be independent with advanced HEP. Baseline: Not yet initiated  Goal status: IN PROGRESS  Patient to demonstrate B LE strength >/=4+/5.  Baseline: See above Goal status: IN PROGRESS  Patient to ambulate 550 M during . Baseline: 504 M 01/01/24 Goal status: IN PROGRESS 01/01/24  Patient to demonstrate STS 10x within full ROM and good eccentric control.  Baseline: not standing fully and limited eccentric control Goal status: IN PROGRESS  Patient to report return to water aerobics without fatigue limiting.   Baseline: notes fatigue; has been going to Flex and Flow at [facility] Goal status: IN PROGRESS   ASSESSMENT:  CLINICAL IMPRESSION: Pt presents today with no new complaints; she continues to report stiffness occasionally through the day. Skilled PT session focused on aerobic warm up, dynamic balance activities and functional strength in RLE.  Pt has good eccentric control with sit<>stand, she does continue to have difficulty with SLS on RLE.  Pt will continue to benefit from skilled PT towards goals for improved functional mobility and decreased fall risk.   OBJECTIVE IMPAIRMENTS: Abnormal gait, decreased activity tolerance, decreased coordination, decreased endurance, difficulty walking, decreased strength, postural dysfunction, and pain.   ACTIVITY LIMITATIONS: carrying, lifting, bending, standing, squatting, stairs, transfers, and locomotion level  PARTICIPATION LIMITATIONS: meal prep, cleaning, laundry, shopping, community activity, occupation, yard work, and church  PERSONAL FACTORS: Age, Past/current experiences, Time since onset of injury/illness/exacerbation, and 1-2 comorbidities: HTN, HLD are also affecting patient's functional outcome.   REHAB POTENTIAL: Good  CLINICAL DECISION MAKING: Evolving/moderate complexity  EVALUATION COMPLEXITY: Moderate  PLAN:  PT FREQUENCY: 1-2x/week  PT DURATION: 6 weeks  PLANNED INTERVENTIONS: 97164- PT Re-evaluation, 97750- Physical Performance Testing, 97110-Therapeutic exercises, 97530- Therapeutic activity, [id number]- Neuromuscular re-education, 97535- Self Care, 02859- Manual therapy, [id number]- Gait training, [phone]- Canalith repositioning, [id number]- Aquatic Therapy, [phone]- Electrical stimulation (manual), [phone] (1-2 muscles), 20561 (3+ muscles)- Dry Needling, Patient/Family education, Balance training, Stair training, Taping, Joint mobilization, Spinal mobilization, Vestibular training, DME instructions, Cryotherapy, and Moist heat  PLAN FOR NEXT  SESSION: Continue to progress HEP for RLE strengthening and endurance (did she look into aerobic step at [facility]?); R glut activation, RLE SLS activities.  Consider interval training to improve activity tolerance.  Will need to check LTGs and discuss POC in next few visits.  Greig Anon, PT 02/05/24 7:59 AM Phone: [phone] Fax: [phone]   [facility] at [facility] [address] [place], [address] [place], [place] [address] Phone # [phone] Fax # [phone]

## 2024-02-07 ENCOUNTER — Ambulatory Visit: Admitting: Physical Therapy

## 2024-02-07 ENCOUNTER — Ambulatory Visit

## 2024-02-07 DIAGNOSIS — M6281 Muscle weakness (generalized): Secondary | ICD-10-CM | POA: Diagnosis not present

## 2024-02-07 DIAGNOSIS — G8929 Other chronic pain: Secondary | ICD-10-CM

## 2024-02-07 DIAGNOSIS — R2689 Other abnormalities of gait and mobility: Secondary | ICD-10-CM

## 2024-02-07 DIAGNOSIS — R3915 Urgency of urination: Secondary | ICD-10-CM | POA: Diagnosis not present

## 2024-02-07 DIAGNOSIS — R278 Other lack of coordination: Secondary | ICD-10-CM

## 2024-02-07 NOTE — Therapy (Signed)
 OUTPATIENT OCCUPATIONAL THERAPY NEURO Treatment Note  Patient Name: Veronica Webb MRN: [id number] DOB:[birth year], 83 y.o., female Today's Date: 02/07/2024  PCP: Dwight Trula SQUIBB, MD REFERRING PROVIDER: Whitfield Raisin, NP  END OF SESSION:  OT End of Session - 02/07/24 0830     Visit Number 4    Number of Visits 7    Date for Recertification  02/23/24    Authorization Type MCR A&B    OT Start Time 0803    OT Stop Time 0846    OT Time Calculation (min) 43 min    Equipment Utilized During Treatment jenga, small pegs and peg board    Activity Tolerance Patient tolerated treatment well    Behavior During Therapy WFL for tasks assessed/performed             Past Medical History:  Diagnosis Date   Hypothyroidism    Past Surgical History:  Procedure Laterality Date   ABDOMINAL HYSTERECTOMY  1985   ovaries removed   COLONOSCOPY     COLONOSCOPY WITH PROPOFOL  N/A 01/29/2013   Procedure: COLONOSCOPY WITH PROPOFOL ;  Surgeon: Gladis MARLA Louder, MD;  Location: [facility];  Service: Endoscopy;  Laterality: N/A;   EYE SURGERY     cataract   Patient Active Problem List   Diagnosis Date Noted   Acute ischemic stroke (HCC) 09/21/2022    ONSET DATE: 12/21/23 referral date, 09/21/2022 stroke date (was hospitalized 07/24-07/26/2024)  REFERRING DIAG: M25.511,G89.29 (ICD-10-CM) - Chronic right shoulder pain Add'l note from referring provider: Post stroke right shoulder pain and decreased R hand dexterity  THERAPY DIAG:  Muscle weakness (generalized)  Chronic right shoulder pain  Other abnormalities of gait and mobility  Other lack of coordination  Rationale for Evaluation and Treatment: Rehabilitation  SUBJECTIVE:   SUBJECTIVE STATEMENT: Pt reports she had some nubness and tingling in RUE that traveled to LUE.  Pt accompanied by: self  PERTINENT HISTORY: acute ischemic stroke, hypothyroidism, cataract surgery, macular pin  PRECAUTIONS: None  WEIGHT BEARING RESTRICTIONS:  No  PAIN:  Are you having pain? Yes: NPRS scale: 2/10 Pain location: R hand into forearm and upper arm Pain description: tingling up the arm Aggravating factors: excessive movement Relieving factors: rest   FALLS: Has patient fallen in last 6 months? No  LIVING ENVIRONMENT: Lives with: lives with their spouse Lives in: House/apartment [facility] Stairs: No Has following equipment at home: Tour manager, Grab bars, and walking stick  PLOF: Independent and Leisure: was doing water aerobics and walking  PATIENT GOALS: I would like to not have this tingling.   OBJECTIVE:  Note: Objective measures were completed at Evaluation unless otherwise noted.  HAND DOMINANCE: Right  ADLs: Overall ADLs: Independent  IADLs: Independent with all IADL, does not cook but can if needed. Gets meals at ILF   MOBILITY STATUS: Independent  POSTURE COMMENTS:  No Significant postural limitations   ACTIVITY TOLERANCE: Activity tolerance: no concerns noted at this time  FUNCTIONAL OUTCOME MEASURES: Upper Extremity Functional Scale (UEFS): 73/80  UPPER EXTREMITY ROM:  WNL  Active ROM Right eval Left eval  Shoulder flexion    Shoulder abduction    Shoulder adduction    Shoulder extension    Shoulder internal rotation    Shoulder external rotation    Elbow flexion    Elbow extension    Wrist flexion    Wrist extension    Wrist ulnar deviation    Wrist radial deviation    Wrist pronation    Wrist supination    (  Blank rows = not tested)  UPPER EXTREMITY MMT:   4/5 grossly   HAND FUNCTION: Grip strength: Right: 30 lbs; Left: 25.67 lbs  COORDINATION: 9 Hole Peg test: Right: 23.70 sec; Left: 22.59 sec Box and Blocks:  Right 50 blocks, Left 55blocks  SENSATION:Reports tingling in RUE (specifically the hand) and that it travels to the L sometimes   COGNITION: Overall cognitive status: Within functional limits for tasks assessed  VISION: Subjective report: Pt reports that  she had difficulty reading when she had the stroke, but that it is fine now. Baseline vision: Wears glasses all the time Visual history: cataracts, macular pinch  VISION ASSESSMENT: Not tested  Patient has difficulty with following activities due to following visual impairments: none  PERCEPTION: Not tested  PRAXIS: Not tested  OBSERVATIONS: Decreased dexterity/FM coordination in R hand, sensation concerns in R hand chiefly (with occasional sensation concerns traveling to LUE), occasional pain in RUE as well                                                                                                                              TREATMENT DATE:  02/07/24 Self care training completed for duration below including the following: Pt reports she has not completed supine exercises, educated in purpose of gravity eliminated exercises for improved partiicpatoin and reduced pain with shoulder abduction.  Educated in proper use of heat and cold modalities including safety precautions. See Pt instructions for handouts provided. Therapeutic activities completed for duration below including the following:  Patient used R hand to assemble small Jenga tower, and then removed approximately pieces individually until the tower fell over for fine motor coordination of affected extremity.  Next pt utilized tip to tip pinch of R hand recreating pattern on pegboard with small pegs. Therapeutic exercise completed for duration below including the following:  Pt instructed in R shoulder isometrics to improve strength for improved functional use. Pt provided visual demo and verbal instruction and handout for completion at home. Please see Pt instructions for details. Pt denied any pain with these, instructed to stop should undue pain occur. 01/30/24 Clothespins: Resistance Clothespins 2,4,6# with RUE for high functional reaching (shoulder flexion) and sustained pinch. Pt demonstrating good flexion overhead,  however reporting fatigue with pinch and soreness in biceps after repetitive reaching.  Shoulder ROM: attempted shoulder ROM with yellow theraband with pt tolerating shoulder flexion, however reporting pain with attempts at abduction.  OT downgraded task to have pt complete ROM in supine to decrease effects of gravity on movement.  Engaged in shoulder flexion, abduction, and internal/external rotation in supine.  OT providing education on technique and positioning to increase ROM and ease.  Pt completed each x10.  Provided with handout.    01/16/24 Coordination:  OT instructing in various coordination tasks with household items.  Provided with HEP with pictures for carryover to home. Picking up various small items with R hand.  Pt reporting tingling in hand impacting  ease. Stacking coins and unstacking Rotating coins in finger tips Picking up 5-10 coins 1 at a time and translating palm to fingertips to place into coin slot Rotating golf balls in palm of hand clockwise Sensation: OT educating on re-sensitization strategies and providing education on the sensory and motor pathways in the brain.  OT educating on use of vision to compensate for impaired sensation.  OT providing with handout         PATIENT EDUCATION: Education details: Isometrics HEP Person educated: Patient Education method: Programmer, Multimedia, Facilities Manager, and Handouts Education comprehension: verbalized understanding, returned demonstration, and needs further education  HOME EXERCISE PROGRAM: Access Code: [email or web address] URL: [email or web address] Date: 01/30/2024 Prepared by: [facility] - Outpatient  Rehab - [facility]  Exercises - Seated Shoulder Blade Squeeze   - Seated Shoulder Rolls   - Supine Shoulder Flexion Extension Full Range AROM  - Shoulder Abduction Adduction   - Single Arm Shoulder External Rotation    02/07/24 Access Code: [email or web address] URL: [email or web address] Date:  02/07/2024  Exercises - Seated Isometric Shoulder Flexion  - 1 x daily - 4-5 x weekly - 3 sets - 5 reps - 3-5 hold - Seated Isometric Shoulder Extension  - 1 x daily - 4-5 x weekly - 3 sets - 5 reps - 3-5 hold - Seated Isometric Shoulder External Rotation with Towel  - 1 x daily - 4-5 x weekly - 3 sets - 5 reps - 3-5 hold - Seated Isometric Shoulder Internal Rotation with Towel  - 1 x daily - 4-5 x weekly - 3 sets - 5 reps - 3-5 hold - Standing Isometric Shoulder Abduction with Doorway - Arm Bent  - 1 x daily - 4-5 x weekly - 3 sets - 5 reps - 3-5 hold  GOALS: Goals reviewed with patient? Yes  SHORT TERM GOALS: Target date: 02/16/24  Pt will be independent with HEPs for UE strength and coordination Baseline: New to OP OT Goal status:  in progress  2.  Pt will be educated in AE to improve ability to complete tasks such as opening jars Baseline: New to OP OT Goal status: in progress  3.  Pt will be educated in modality use to reduce pain prn Baseline: New to OP OT Goal status:  in progress    LONG TERM GOALS: Target date: 02/23/24  Pt will demonstrate improved function by a score of at least 75/80 on UEFS Baseline: 73/80 Goal status:  in progress  2.  Pt will be able to place at least 55 blocks using right hand with completion of Box and Blocks test.  Baseline:  Right 50 blocks, Left 55blocks Goal status:  in progress  3.  Patient will demo improved FM coordination as evidenced by completing nine-hole peg with use of R hand in 21 seconds or less.  Baseline: Right: 23.70 sec; Left: 22.59 sec Goal status:  in progress   ASSESSMENT:  CLINICAL IMPRESSION: Patient is a 83 y.o. female who was seen today for occupational therapy treatment for RUE weakness and pain. Hx includes acute ischemic stroke, cataract surgery, hypothyroidism. Pt participating well with isometrics and receptive to education for modality usage for reducing pain. Pt will continue to benefit from skilled OT  services in the outpatient setting to work on impairments as noted below to help pt return to PLOF as able.     PERFORMANCE DEFICITS: in functional skills including IADLs, coordination, dexterity, sensation, pain, Fine motor control, and UE functional use, cognitive skills including safety awareness, and  psychosocial skills including coping strategies and environmental adaptation.    PLAN:  OT FREQUENCY: 1x/week  OT DURATION: 6 weeks  PLANNED INTERVENTIONS: 97168 OT Re-evaluation, 97535 self care/ADL training, 02889 therapeutic exercise, 97530 therapeutic activity, 97112 neuromuscular re-education, 97140 manual therapy, passive range of motion, energy conservation, coping strategies training, patient/family education, and DME and/or AE instructions  RECOMMENDED OTHER SERVICES: none  CONSULTED AND AGREED WITH PLAN OF CARE: Patient  PLAN FOR NEXT SESSION: F/u isometric shoulder HEP Review shoulder HEP F/u sleep positioning Coordination activities Sensation loss mgmt  Rocky Dutch, OTR/L 02/07/2024, 10:14 AM  [facility] at [facility] [address], [address] [place], [place] [address] Phone # [phone] Fax # [phone]

## 2024-02-07 NOTE — Patient Instructions (Addendum)
 How to Use Cold Therapy Cold therapy uses cold temperatures to treat an injury or other problem. You can use cold packs or ice packs to help with pain and swelling. Only use cold therapy if your doctor says it's OK. What are the risks? Your doctor will talk with you about risks. You may be told to avoid cold therapy if: You're not able to let people know when you're in pain. Young children and people who have a brain problem called dementia may have trouble with this. You have certain conditions, such as: Raynaud's syndrome. This is a problem with your blood vessels. It slows blood flow to your fingers and toes. Feeling very cold easily. Lack of feeling in the area being iced. Do not use cold therapy unless your doctor says it's OK if you have: A heart condition. High blood pressure. Open or healing wounds. An infection. Rheumatoid arthritis. This is pain and swelling in your joints. Poor blood flow. Diabetes. Certain skin problems. How do I make a cold pack? There are a few things you can use to make a cold pack at home. These include: A silica gel cold pack that's been left in the freezer. You can buy this online or in stores. A sealable plastic bag filled with crushed ice. A washcloth or paper towels soaked in cold water or ice water. A plastic bag of frozen vegetables. Throw the bag away when you're done using it as a cold pack. Supplies needed: A cold pack. A towel. This can be dry or damp. How to use cold therapy  Have your cold pack ready. Place a towel between the cold pack and your skin. Or, you can wrap the cold pack in a towel. Use the cold pack. Leave it on for no more than 20 minutes at a time. Check your skin after 5 minutes for any problems. Check for: White spots on your skin. Your skin may look blotchy or mottled. Skin that looks blue or pale. Skin that feels waxy or hard. Do these steps as many times each day as told. If your skin turns red, take off the cold  pack right away to prevent skin damage. The risk of damage is higher if you can't feel pain, heat, or cold. Be sure to always use a towel. Do not put the cold pack right on your skin. Contact a doctor if: You start to have white spots on your skin. Your skin turns blue or pale. Your skin gets waxy or hard. Your swelling gets worse. This information is not intended to replace advice given to you by your health care provider. Make sure you discuss any questions you have with your health care provider. Document Revised: 09/21/2022 Document Reviewed: 09/21/2022 Elsevier Patient Education  2024 [employer]How to Use Heat Therapy Heat therapy is the use of heat to relax your muscles. This can help with pain and muscle spasms. Using heat can help if your muscles or joints are: Sore. Stiff. Injured. Tight. What are the risks? Unless your doctor says it's OK, do not use heat therapy if you have any of these things: New bruises. Open or healing wounds. Skin problems, such as: Infected skin. Scars in the spot being treated. Blood problems, such as: Bleeding. Poor blood flow. Blood clots. Loss of feeling in the spot being treated. Swelling that's not normal. Conditions, such as: Diabetes. Heart disease. Cancer. Trouble letting people know when you're in pain. Young children and people who have a brain problem called dementia  may have trouble with this. How to use heat therapy Use the heat source that your doctor recommends, such as: A moist heat pack. A hot water bottle. This should be warm but not too hot. An electric heating pad. A heated gel pack. A heated wrap. A warm water bath. Use heat as told. In most cases, you should: Place a towel between your skin and the heat source. Leave the heat on for 20-30 minutes. Your skin may turn pink. If your skin turns red, take off the heat right away to prevent burns. The risk of burns is higher if you can't feel pain, heat, or cold. If  told, you can also take a warm water bath. To do so: Put a non-slip pad in the bathtub to prevent a fall. Fill the bathtub with warm water. Check the water temperature. Soak in the water for 15-20 minutes, or for as long as you're told. Be careful when you stand up after the bath. You may feel dizzy. Pat yourself dry after the bath. Do not rub your skin to dry it. General recommendations for heat therapy Be careful to avoid burns. You can get burns from: High heat. Keeping the heat on your skin for too long. Do not sleep while using heat therapy. Only use heat therapy when you're awake. Check your skin during heat therapy. Do not use heat therapy: For new injuries. On skin that's irritated. This may be from a rash or sunburn. If your skin is red or turns red. Contact a doctor if: You have any of these things in the spot on your body where you use heat therapy: Blisters. Redness. Swelling. Loss of feeling. You have new pain. Your pain gets worse. This information is not intended to replace advice given to you by your health care provider. Make sure you discuss any questions you have with your health care provider. Document Revised: 09/21/2022 Document Reviewed: 09/21/2022 Elsevier Patient Education  2024 [employer].

## 2024-02-07 NOTE — Therapy (Signed)
 OUTPATIENT PHYSICAL THERAPY NEURO TREATMENT   Patient Name: Veronica Webb MRN: [id number] DOB:[birth year], 83 y.o., female Today's Date: 02/07/2024   PCP: Dwight Trula SQUIBB, MD  REFERRING PROVIDER:    Whitfield Raisin, NP    END OF SESSION:  PT End of Session - 02/07/24 0849     Visit Number 9    Number of Visits 13    Date for Recertification  02/15/24    Authorization Type Medicare/BCBS    PT Start Time 0845    PT Stop Time 0930    PT Time Calculation (min) 45 min    Equipment Utilized During Treatment Gait belt    Activity Tolerance Patient tolerated treatment well    Behavior During Therapy WFL for tasks assessed/performed               Past Medical History:  Diagnosis Date   Hypothyroidism    Past Surgical History:  Procedure Laterality Date   ABDOMINAL HYSTERECTOMY  1985   ovaries removed   COLONOSCOPY     COLONOSCOPY WITH PROPOFOL  N/A 01/29/2013   Procedure: COLONOSCOPY WITH PROPOFOL ;  Surgeon: Gladis MARLA Louder, MD;  Location: [facility];  Service: Endoscopy;  Laterality: N/A;   EYE SURGERY     cataract   Patient Active Problem List   Diagnosis Date Noted   Acute ischemic stroke (HCC) 09/21/2022    ONSET DATE: CVA  09/21/2022  REFERRING DIAG: P36.467 (ICD-10-CM) - 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) R20.2 (ICD-10-CM) - Paresthesia M25.511,G89.29 (ICD-10-CM) - Chronic right shoulder pain  THERAPY DIAG:  Muscle weakness (generalized)  Other abnormalities of gait and mobility  Rationale for Evaluation and Treatment: Rehabilitation  SUBJECTIVE:                                                                                                                                                                                             SUBJECTIVE STATEMENT: Went to gym yesterday and rode NU-step x 20 min and walked the campus  Pt accompanied by: self  PERTINENT HISTORY: HLD, HTN  PAIN:  Are you having pain?  No Always take Tylenol  PRECAUTIONS: None  RED FLAGS: None   WEIGHT BEARING RESTRICTIONS: No  FALLS: Has patient fallen in last 6 months? No  LIVING ENVIRONMENT: Lives with: lives with their spouse Lives in: House/apartment [facility] Stairs: none Has following equipment at home: Shower bench, Grab bars, and walking stick  PLOF: Independent and Leisure: was previously doing water aerobics but is having a hard time doing this now, walking   PATIENT GOALS: I would like to eliminate the pain   OBJECTIVE:  TODAY'S TREATMENT: 02/07/24 Activity Comments  Dynamic standing balance/gait -retrowalk -/+ head movements -high step march -/+ head movements -forward walk w/ hurdles and box step over with carrying task -activities w/ multi and dual tasking   Static multisensory balance   Gastroc stretch 2x60 sec On slantboard                 TODAY'S TREATMENT: 02/05/2024 Activity Comments  NuStep, Level, 3-4, 4 extremities, x 8 minutes For aerobic warm up  Sit to stand, RLE tucked posterior, from mat, then from 18 chair 10 reps each, good eccentric control More difficulty from 18 chair  Dynamic balance upon standing Stagger stance rock forward and back x 10 reps Wide BOS lateral weightshift with reaching x 10 reps   Forward/back walking x 25 ft, 3 reps Forward high knee march 25 ft x 4 reps Weighted carry, single arm and bilateral arms, 4# Difficulty with SLS with marching    Postural cues  -Sidestep squats 20 ft x 2 reps -Monster walk 20 ft x 2 Cues for technique   Forward step ups RLE leading 2 x 10 Intermittent UE support         HOME EXERCISE PROGRAM   Access Code: [email or web address] URL: [email or web address] Date: 02/01/2024 Prepared by: [facility] - Outpatient  Rehab - [facility]  Exercises - Sit to Stand with Arms Crossed  - 1 x daily - 5 x weekly - 2 sets - 30 sec hold - Standing March with Counter Support  - 1 x  daily - 5 x weekly - 2 sets - 30 sec hold - Standing Hip Extension with Counter Support  - 1 x daily - 5 x weekly - 2 sets - 30 sec hold - Side Stepping with Counter Support  - 1 x daily - 5 x weekly - 2 sets - 60 sec hold - Sit to stand in stride stance  - 1 x daily - 5 x weekly - 3 sets - 5 reps - Alternating Step Taps with Counter Support  - 1 x daily - 5 x weekly - 3 sets - 10 reps - Heel Toe Raises with Counter Support  - 1 x daily - 7 x weekly - 3 sets - 10 reps - Standing posture stretch  - 1 x daily - 7 x weekly - 1 sets - 5 reps - Standing Gastroc Stretch at Counter  - 1 x daily - 7 x weekly - 1 sets - 3 reps - 30sec hold - Seated Hamstring Stretch  - 1 x daily - 7 x weekly - 1 sets - 3 reps - 30 sec hold - Staggered Stance Forward Backward Weight Shift with Counter Support  - 1 x daily - 7 x weekly - 2 sets - 10 reps    PATIENT EDUCATION: Education details: Consider looking into fitness center at [facility]- do they have aerobic steps?  Consistent exercise activities every day (Flex and Flow class 2x/wk, walking, Nustep, etc) Person educated: Patient Education method: Explanation, Demonstration, and Verbal cues Education comprehension: verbalized understanding and returned demonstration    Note: Objective measures were completed at Evaluation unless otherwise noted.  DIAGNOSTIC FINDINGS: none recent  COGNITION: Overall cognitive status: Within functional limits for tasks assessed   SENSATION: WNL to light tough in UEs and LEs   COORDINATION: Alternating pronation/supination: WNL B Alternating toe tap: WNL B Finger to nose: slight dysmetria R   MUSCLE TONE: WNL B LEs  POSTURE: rounded shoulders, flexed trunk  Palpation:  c/o TTP and slightly tight in the R proximal biceps tendon   LOWER EXTREMITY ROM:     Active  Right Eval Left Eval  Hip flexion    Hip extension    Hip abduction    Hip adduction    Hip internal rotation    Hip external rotation    Knee  flexion    Knee extension 12 14  Ankle dorsiflexion    Ankle plantarflexion    Ankle inversion    Ankle eversion     (Blank rows = not tested)  LOWER EXTREMITY MMT:    MMT Right Eval Left Eval  Hip flexion 3 3  Hip extension    Hip abduction 4 4  Hip adduction 4+ 4+  Hip internal rotation    Hip external rotation    Knee flexion 3+ 3+  Knee extension 4+ 4+  Ankle dorsiflexion 4+ 4+  Ankle plantarflexion Unable to perform without compensations in standing Unable to perform without compensations in standing  Ankle inversion    Ankle eversion    (Blank rows = not tested)  GAIT: Findings: Assistive device utilized:None, Level of assistance: Complete Independence, and Comments: slight L hip drop, slight trunk flexion    FUNCTIONAL TESTS:  5 times sit to stand: 13.29 sec with cues to stand up fully, limited eccentric control and c/o L knee pain  10 meter walk test: 8.16 sec (4.02 ft/sec)                                                                                                 TREATMENT DATE: 12/21/23    PATIENT EDUCATION: Education details: prognosis, POC, exam findings as they relate to patient's impairments, edu on benefits of OT for writing, hand dexterity, shoulder pain- pt agreeable Person educated: Patient Education method: Explanation, Demonstration, Tactile cues, Verbal cues, and Handouts Education comprehension: verbalized understanding and returned demonstration  HOME EXERCISE PROGRAM: Not yet initiated  GOALS: Goals reviewed with patient? Yes  SHORT TERM GOALS: Target date: 01/11/2024  Patient to be independent with initial HEP. Baseline: HEP initiated 01/01/24 Goal status: MET    LONG TERM GOALS: Target date: 02/15/2024  Patient to be independent with advanced HEP. Baseline: Not yet initiated  Goal status: IN PROGRESS  Patient to demonstrate B LE strength >/=4+/5.  Baseline: See above Goal status: IN PROGRESS  Patient to ambulate 550 M  during . Baseline: 504 M 01/01/24 Goal status: IN PROGRESS 01/01/24  Patient to demonstrate STS 10x within full ROM and good eccentric control.  Baseline: not standing fully and limited eccentric control Goal status: IN PROGRESS  Patient to report return to water aerobics without fatigue limiting.  Baseline: notes fatigue; has been going to Flex and Flow at [employer] Goal status: IN PROGRESS   ASSESSMENT:  CLINICAL IMPRESSION: Session focus on dynamic and static multisensory balance challenges incorporating layers of challenge such as balance tasks with multi and dual-tasking to improve balance and reduce fall risk and improve mobility in community context in order to be able to walk and attend to surroundings, conversation, etc.  Overall, she did  excellent in all challenges with instances of right lateral LOB especially with sharp turns to the right and with single limb support demands on right but able to recover via righting reactions.  Reports she has since been to fitness facility at her home and performed NU-step x 20 min without issue.  Continued sessions to refine HEP and/or make relevant recommendations for activities at D/C  OBJECTIVE IMPAIRMENTS: Abnormal gait, decreased activity tolerance, decreased coordination, decreased endurance, difficulty walking, decreased strength, postural dysfunction, and pain.   ACTIVITY LIMITATIONS: carrying, lifting, bending, standing, squatting, stairs, transfers, and locomotion level  PARTICIPATION LIMITATIONS: meal prep, cleaning, laundry, shopping, community activity, occupation, yard work, and church  PERSONAL FACTORS: Age, Past/current experiences, Time since onset of injury/illness/exacerbation, and 1-2 comorbidities: HTN, HLD are also affecting patient's functional outcome.   REHAB POTENTIAL: Good  CLINICAL DECISION MAKING: Evolving/moderate complexity  EVALUATION COMPLEXITY: Moderate  PLAN:  PT FREQUENCY: 1-2x/week  PT  DURATION: 6 weeks  PLANNED INTERVENTIONS: 97164- PT Re-evaluation, 97750- Physical Performance Testing, 97110-Therapeutic exercises, 97530- Therapeutic activity, [id number]- Neuromuscular re-education, 97535- Self Care, [id number]- Manual therapy, [id number]- Gait training, [phone]- Canalith repositioning, [id number]- Aquatic Therapy, [phone]- Electrical stimulation (manual), [phone] (1-2 muscles), [id number] (3+ muscles)- Dry Needling, Patient/Family education, Balance training, Stair training, Taping, Joint mobilization, Spinal mobilization, Vestibular training, DME instructions, Cryotherapy, and Moist heat  PLAN FOR NEXT SESSION: Continue to progress HEP for RLE strengthening and endurance (did she look into aerobic step at [facility]?); R glut activation, RLE SLS activities.  Consider interval training to improve activity tolerance.  Will need to check LTGs and discuss POC in next few visits.  9:30 AM, 02/07/24 M. Kelly Berish Bohman, PT, DPT Physical Therapist- [place] Office Number: [phone]

## 2024-02-08 NOTE — Therapy (Incomplete)
 OUTPATIENT PHYSICAL THERAPY NEURO PROGRESS NOTE   Patient Name: Veronica Webb MRN: [id number] DOB:26-Jun-1940, 83 y.o., female Today's Date: 02/08/2024   PCP: Dwight Trula SQUIBB, MD  REFERRING PROVIDER:    Whitfield Raisin, NP   Progress Note Reporting Period 12/21/23 to 02/13/24  See note below for Objective Data and Assessment of Progress/Goals.      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  Rationale for Evaluation and Treatment: Rehabilitation  SUBJECTIVE:                                                                                                                                                                                             SUBJECTIVE STATEMENT: Went to gym yesterday and rode NU-step x 20 min and walked the campus  Pt accompanied by: self  PERTINENT HISTORY: HLD, HTN  PAIN:  Are you having pain? No Always take Tylenol  PRECAUTIONS: None  RED FLAGS: None   WEIGHT BEARING RESTRICTIONS: No  FALLS: Has patient fallen in last 6 months? No  LIVING ENVIRONMENT: Lives with: lives with their spouse Lives in: House/apartment [facility] Stairs: none Has following equipment at home: Shower bench, Grab bars,  and walking stick  PLOF: Independent and Leisure: was previously doing water aerobics but is having a hard time doing this now, walking   PATIENT GOALS: I would like to eliminate the pain   OBJECTIVE:     TODAY'S TREATMENT: 02/13/24 Activity Comments                        TODAY'S TREATMENT: 02/07/24 Activity Comments  Dynamic standing balance/gait -retrowalk -/+ head movements -high step march -/+ head movements -forward walk w/ hurdles and box step over with carrying task -activities w/ multi and dual tasking   Static multisensory balance   Gastroc stretch  2x60 sec On slantboard                 TODAY'S TREATMENT: 02/05/2024 Activity Comments  NuStep, Level, 3-4, 4 extremities, x 8 minutes For aerobic warm up  Sit to stand, RLE tucked posterior, from mat, then from 18 chair 10 reps each, good eccentric control More difficulty from 18 chair  Dynamic balance upon standing Stagger stance rock forward and back x 10 reps Wide BOS lateral weightshift with reaching x 10 reps   Forward/back walking x 25 ft, 3 reps Forward high knee march 25 ft x 4 reps Weighted carry, single arm and bilateral arms, 4# Difficulty with SLS with marching    Postural cues  -Sidestep squats 20 ft x 2 reps -Monster walk 20 ft x 2 Cues for technique   Forward step ups RLE leading 2 x 10 Intermittent UE support         HOME EXERCISE PROGRAM   Access Code: [email or web address] URL: [email or web address] Date: 02/01/2024 Prepared by: [facility] - Outpatient  Rehab - [facility]  Exercises - Sit to Stand with Arms Crossed  - 1 x daily - 5 x weekly - 2 sets - 30 sec hold - Standing March with Counter Support  - 1 x daily - 5 x weekly - 2 sets - 30 sec hold - Standing Hip Extension with Counter Support  - 1 x daily - 5 x weekly - 2 sets - 30 sec hold - Side Stepping with Counter Support  - 1 x daily - 5 x weekly - 2 sets - 60 sec hold - Sit to stand in stride stance  -  1 x daily - 5 x weekly - 3 sets - 5 reps - Alternating Step Taps with Counter Support  - 1 x daily - 5 x weekly - 3 sets - 10 reps - Heel Toe Raises with Counter Support  - 1 x daily - 7 x weekly - 3 sets - 10 reps - Standing posture stretch  - 1 x daily - 7 x weekly - 1 sets - 5 reps - Standing Gastroc Stretch at Counter  - 1 x daily - 7 x weekly - 1 sets - 3 reps - 30sec hold - Seated Hamstring Stretch  - 1 x daily - 7 x weekly - 1 sets - 3 reps - 30 sec hold - Staggered Stance Forward Backward Weight Shift with Counter Support  - 1 x daily - 7 x weekly - 2 sets - 10 reps    PATIENT EDUCATION: Education details: Consider looking into fitness center at [facility]- do they have aerobic steps?  Consistent exercise activities every day (Flex and Flow class 2x/wk, walking, Nustep, etc) Person educated: Patient Education method: Explanation, Demonstration, and Verbal cues Education comprehension: verbalized understanding and returned demonstration    Note: Objective measures were completed at Evaluation unless otherwise noted.  DIAGNOSTIC FINDINGS: none recent  COGNITION: Overall cognitive status: Within functional limits for tasks assessed   SENSATION: WNL to light tough in UEs and LEs   COORDINATION: Alternating pronation/supination: WNL B Alternating toe tap: WNL B Finger to nose: slight dysmetria R   MUSCLE TONE: WNL B LEs  POSTURE: rounded shoulders, flexed trunk  Palpation: c/o TTP and slightly tight in the R proximal biceps tendon   LOWER EXTREMITY ROM:     Active  Right Eval Left Eval  Hip flexion    Hip extension    Hip abduction    Hip adduction  Hip internal rotation    Hip external rotation    Knee flexion    Knee extension 12 14  Ankle dorsiflexion    Ankle plantarflexion    Ankle inversion    Ankle eversion     (Blank rows = not tested)  LOWER EXTREMITY MMT:    MMT Right Eval Left Eval  Hip flexion 3 3  Hip extension    Hip abduction 4 4   Hip adduction 4+ 4+  Hip internal rotation    Hip external rotation    Knee flexion 3+ 3+  Knee extension 4+ 4+  Ankle dorsiflexion 4+ 4+  Ankle plantarflexion Unable to perform without compensations in standing Unable to perform without compensations in standing  Ankle inversion    Ankle eversion    (Blank rows = not tested)  GAIT: Findings: Assistive device utilized:None, Level of assistance: Complete Independence, and Comments: slight L hip drop, slight trunk flexion    FUNCTIONAL TESTS:  5 times sit to stand: 13.29 sec with cues to stand up fully, limited eccentric control and c/o L knee pain  10 meter walk test: 8.16 sec (4.02 ft/sec)                                                                                                 TREATMENT DATE: 12/21/23    PATIENT EDUCATION: Education details: prognosis, POC, exam findings as they relate to patient's impairments, edu on benefits of OT for writing, hand dexterity, shoulder pain- pt agreeable Person educated: Patient Education method: Explanation, Demonstration, Tactile cues, Verbal cues, and Handouts Education comprehension: verbalized understanding and returned demonstration  HOME EXERCISE PROGRAM: Not yet initiated  GOALS: Goals reviewed with patient? Yes  SHORT TERM GOALS: Target date: 01/11/2024  Patient to be independent with initial HEP. Baseline: HEP initiated 01/01/24 Goal status: MET    LONG TERM GOALS: Target date: 02/15/2024  Patient to be independent with advanced HEP. Baseline: Not yet initiated  Goal status: IN PROGRESS  Patient to demonstrate B LE strength >/=4+/5.  Baseline: See above Goal status: IN PROGRESS  Patient to ambulate 550 M during . Baseline: 504 M 01/01/24 Goal status: IN PROGRESS 01/01/24  Patient to demonstrate STS 10x within full ROM and good eccentric control.  Baseline: not standing fully and limited eccentric control Goal status: IN PROGRESS  Patient to report  return to water aerobics without fatigue limiting.  Baseline: notes fatigue; has been going to Flex and Flow at [facility] Goal status: IN PROGRESS   ASSESSMENT:  CLINICAL IMPRESSION: Session focus on dynamic and static multisensory balance challenges incorporating layers of challenge such as balance tasks with multi and dual-tasking to improve balance and reduce fall risk and improve mobility in community context in order to be able to walk and attend to surroundings, conversation, etc.  Overall, she did excellent in all challenges with instances of right lateral LOB especially with sharp turns to the right and with single limb support demands on right but able to recover via righting reactions.  Reports she has since been to fitness facility at her home and performed NU-step  x 20 min without issue.  Continued sessions to refine HEP and/or make relevant recommendations for activities at D/C  OBJECTIVE IMPAIRMENTS: Abnormal gait, decreased activity tolerance, decreased coordination, decreased endurance, difficulty walking, decreased strength, postural dysfunction, and pain.   ACTIVITY LIMITATIONS: carrying, lifting, bending, standing, squatting, stairs, transfers, and locomotion level  PARTICIPATION LIMITATIONS: meal prep, cleaning, laundry, shopping, community activity, occupation, yard work, and church  PERSONAL FACTORS: Age, Past/current experiences, Time since onset of injury/illness/exacerbation, and 1-2 comorbidities: HTN, HLD are also affecting patient's functional outcome.   REHAB POTENTIAL: Good  CLINICAL DECISION MAKING: Evolving/moderate complexity  EVALUATION COMPLEXITY: Moderate  PLAN:  PT FREQUENCY: 1-2x/week  PT DURATION: 6 weeks  PLANNED INTERVENTIONS: 97164- PT Re-evaluation, 97750- Physical Performance Testing, 97110-Therapeutic exercises, 97530- Therapeutic activity, [id number]- Neuromuscular re-education, 97535- Self Care, 02859- Manual therapy, [id number]- Gait training,  [phone]- Canalith repositioning, [id number]- Aquatic Therapy, [phone]- Electrical stimulation (manual), [phone] (1-2 muscles), [phone] (3+ muscles)- Dry Needling, Patient/Family education, Balance training, Stair training, Taping, Joint mobilization, Spinal mobilization, Vestibular training, DME instructions, Cryotherapy, and Moist heat  PLAN FOR NEXT SESSION: Continue to progress HEP for RLE strengthening and endurance (did she look into aerobic step at [facility]?); R glut activation, RLE SLS activities.  Consider interval training to improve activity tolerance.  Will need to check LTGs and discuss POC in next few visits.

## 2024-02-12 ENCOUNTER — Ambulatory Visit: Admitting: Physical Therapy

## 2024-02-13 ENCOUNTER — Ambulatory Visit: Admitting: Occupational Therapy

## 2024-02-13 ENCOUNTER — Ambulatory Visit: Admitting: Physical Therapy

## 2024-02-27 ENCOUNTER — Ambulatory Visit: Admitting: Physical Therapy

## 2024-02-27 ENCOUNTER — Ambulatory Visit: Admitting: Occupational Therapy

## 2024-02-27 ENCOUNTER — Encounter: Payer: Self-pay | Admitting: Physical Therapy

## 2024-02-27 DIAGNOSIS — R278 Other lack of coordination: Secondary | ICD-10-CM

## 2024-02-27 DIAGNOSIS — M6281 Muscle weakness (generalized): Secondary | ICD-10-CM

## 2024-02-27 DIAGNOSIS — R2689 Other abnormalities of gait and mobility: Secondary | ICD-10-CM

## 2024-02-27 DIAGNOSIS — R208 Other disturbances of skin sensation: Secondary | ICD-10-CM

## 2024-02-27 DIAGNOSIS — G8929 Other chronic pain: Secondary | ICD-10-CM

## 2024-02-27 NOTE — Therapy (Addendum)
 " OUTPATIENT PHYSICAL THERAPY NEURO PROGRESS NOTE/RECERT/DISCHARGE SUMMARY   Patient Name: Veronica Webb MRN: [id number] DOB:[birth year], 83 y.o., female Today's Date: 02/27/2024   PCP: Dwight Trula SQUIBB, MD  REFERRING PROVIDER:    Whitfield Raisin, NP   Progress Note Reporting Period 12/21/23 to 02/27/24  See note below for Objective Data and Assessment of Progress/Goals.   PHYSICAL THERAPY DISCHARGE SUMMARY  Visits from Start of Care: 10  Current functional level related to goals / functional outcomes: Pt has met 3 of 5 LTGs-see below   Remaining deficits: Lower extremity strength-improving   Education / Equipment: HEP progression and community fitness options   Patient agrees to discharge. Patient goals were partially met. Patient is being discharged due to being pleased with the current functional level.    END OF SESSION:  PT End of Session - 02/27/24 0850     Visit Number 10    Number of Visits 13    Date for Recertification  02/15/24    Authorization Type Medicare/BCBS    PT Start Time 0851    PT Stop Time 0918    PT Time Calculation (min) 27 min    Equipment Utilized During Treatment --    Activity Tolerance Patient tolerated treatment well    Behavior During Therapy [facility] for tasks assessed/performed                Past Medical History:  Diagnosis Date   Hypothyroidism    Past Surgical History:  Procedure Laterality Date   ABDOMINAL HYSTERECTOMY  1985   ovaries removed   COLONOSCOPY     COLONOSCOPY WITH PROPOFOL  N/A 01/29/2013   Procedure: COLONOSCOPY WITH PROPOFOL ;  Surgeon: Gladis MARLA Louder, MD;  Location: [facility];  Service: Endoscopy;  Laterality: N/A;   EYE SURGERY     cataract   Patient Active Problem List   Diagnosis Date Noted   Acute ischemic stroke (HCC) 09/21/2022    ONSET DATE: CVA  09/21/2022  REFERRING DIAG: P36.467 (ICD-10-CM) - 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  Some days are better than the other.  Pt accompanied by: self  PERTINENT HISTORY: HLD, HTN  PAIN:  Are you having pain? No Always take Tylenol  PRECAUTIONS: None  RED FLAGS: None   WEIGHT BEARING RESTRICTIONS: No  FALLS: Has patient fallen in last 6 months? No  LIVING ENVIRONMENT: Lives with: lives with their spouse Lives in: House/apartment [facility] Stairs: none Has following equipment at home: Shower bench, Grab bars, and walking stick  PLOF: Independent and Leisure: was previously doing water aerobics but is having a hard time doing this now, walking   PATIENT GOALS: I would like to eliminate the pain   OBJECTIVE:     TODAY'S TREATMENT: 02/27/24 Activity Comments  MMT See below  FTSTS 14.16 sec RLE tucked 12.94 sec feet equally spaced   6 MWT:  1320 ft Improved from 504 ft  Verbally reviewed HEP Good verbalization of HEP              HOME EXERCISE PROGRAM   Access Code: [email or web address] URL: [email or web address] Date: 02/01/2024 Prepared by: [facility] - Outpatient  Rehab - [facility]  Exercises - Sit to Stand with Arms Crossed  - 1 x daily - 5 x weekly - 2 sets - 30 sec hold - Standing March with Counter Support  - 1 x daily - 5 x weekly - 2 sets - 30 sec hold - Standing Hip Extension with Counter Support  - 1 x daily - 5 x weekly - 2 sets - 30 sec hold - Side Stepping  with Counter Support  - 1 x daily - 5 x weekly - 2 sets - 60 sec hold - Sit to stand in stride stance  - 1 x daily - 5 x weekly - 3 sets - 5 reps - Alternating Step Taps with Counter Support  - 1 x daily - 5 x weekly - 3 sets - 10 reps - Heel Toe Raises with Counter Support  - 1 x daily - 7 x weekly - 3 sets - 10 reps - Standing posture stretch  - 1 x daily - 7 x weekly - 1 sets - 5 reps - Standing Gastroc Stretch at Counter  - 1 x daily - 7 x weekly - 1 sets - 3 reps - 30sec hold - Seated Hamstring Stretch  - 1 x daily - 7 x weekly - 1 sets - 3 reps - 30 sec hold - Staggered Stance Forward Backward Weight Shift with Counter Support  - 1 x daily - 7 x weekly - 2 sets - 10 reps    PATIENT EDUCATION: Education details: Progress toward goals, POC, plans for discharge today; discussed importance of consistency of HEP and overall continued fitness program Person educated: Patient Education method: Explanation, Demonstration, and Verbal cues Education comprehension: verbalized understanding and returned demonstration    Note: Objective measures were completed at Evaluation unless otherwise noted.  DIAGNOSTIC FINDINGS: none recent  COGNITION: Overall cognitive status: Within functional limits for tasks assessed   SENSATION: WNL to light tough in UEs and LEs   COORDINATION: Alternating pronation/supination: WNL B Alternating toe tap: WNL B Finger to nose: slight dysmetria R   MUSCLE TONE: WNL B LEs  POSTURE: rounded shoulders, flexed trunk  Palpation: c/o TTP and slightly tight in the R proximal biceps tendon   LOWER EXTREMITY ROM:     Active  Right Eval Left Eval  Hip flexion    Hip extension    Hip abduction  Hip adduction    Hip internal rotation    Hip external rotation    Knee flexion    Knee extension 12 14  Ankle dorsiflexion    Ankle plantarflexion    Ankle inversion    Ankle eversion     (Blank rows = not tested)  LOWER EXTREMITY MMT:    MMT  Right Eval Left Eval Right 02/27/2024 Left 02/27/2024  Hip flexion 3 3 4 4   Hip extension      Hip abduction 4 4 4 4   Hip adduction 4+ 4+ 4+ 4+  Hip internal rotation      Hip external rotation      Knee flexion 3+ 3+ 3+ 4  Knee extension 4+ 4+ 4+ 4+  Ankle dorsiflexion 4+ 4+ 4 4+  Ankle plantarflexion Unable to perform without compensations in standing Unable to perform without compensations in standing Unable to perform >3 reps without compensations in standing Performs 11 in standing   Ankle inversion      Ankle eversion      (Blank rows = not tested)  GAIT: Findings: Assistive device utilized:None, Level of assistance: Complete Independence, and Comments: slight L hip drop, slight trunk flexion    FUNCTIONAL TESTS:  5 times sit to stand: 13.29 sec with cues to stand up fully, limited eccentric control and c/o L knee pain  10 meter walk test: 8.16 sec (4.02 ft/sec)                                                                                                 TREATMENT DATE: 12/21/23    PATIENT EDUCATION: Education details: prognosis, POC, exam findings as they relate to patient's impairments, edu on benefits of OT for writing, hand dexterity, shoulder pain- pt agreeable Person educated: Patient Education method: Explanation, Demonstration, Tactile cues, Verbal cues, and Handouts Education comprehension: verbalized understanding and returned demonstration  HOME EXERCISE PROGRAM: Not yet initiated  GOALS: Goals reviewed with patient? Yes  SHORT TERM GOALS: Target date: 01/11/2024  Patient to be independent with initial HEP. Baseline: HEP initiated 01/01/24 Goal status: MET    LONG TERM GOALS: Target date: 02/15/2024  Patient to be independent with advanced HEP. Baseline:  Goal status: MET12/30/2025  Patient to demonstrate B LE strength >/=4+/5.  Baseline: See above Goal status: PARTIALLY MET, 02/27/2024  Patient to ambulate 550 M during . Baseline:  1320 ft 02/27/2024 Goal status: MET   Patient to demonstrate STS 10x within full ROM and good eccentric control.  Baseline:  limited eccentric control with fatigue Goal status: MET 02/27/2024  Patient to report return to water aerobics without fatigue limiting.  Baseline: notes fatigue; has been going to Flex and Flow at [facility] Goal status: NOT MET12/30/2025   ASSESSMENT:  CLINICAL IMPRESSION: Pt presents today and reports today is a good day; still having good/bad days. Skilled PT session focused on checking goals, with pt meeting 3 of 5 LTGs.  She has comprehensive HEP and is performing consistently plus classes at [employer].  She has improved BLE strength, just not to goal level.  She has improved sit to  stand and 6 MWT measures.  She feels good to discharge today with her current options for continued exercise.   OBJECTIVE IMPAIRMENTS: Abnormal gait, decreased activity tolerance, decreased coordination, decreased endurance, difficulty walking, decreased strength, postural dysfunction, and pain.   ACTIVITY LIMITATIONS: carrying, lifting, bending, standing, squatting, stairs, transfers, and locomotion level  PARTICIPATION LIMITATIONS: meal prep, cleaning, laundry, shopping, community activity, occupation, yard work, and church  PERSONAL FACTORS: Age, Past/current experiences, Time since onset of injury/illness/exacerbation, and 1-2 comorbidities: HTN, HLD are also affecting patient's functional outcome.   REHAB POTENTIAL: Good  CLINICAL DECISION MAKING: Evolving/moderate complexity  EVALUATION COMPLEXITY: Moderate  PLAN:  PT FREQUENCY: 1-2x/week  PT DURATION: 6 weeks  PLANNED INTERVENTIONS: 97164- PT Re-evaluation, 97750- Physical Performance Testing, 97110-Therapeutic exercises, 97530- Therapeutic activity, [id number]- Neuromuscular re-education, 97535- Self Care, 02859- Manual therapy, [id number]- Gait training, [phone]- Canalith repositioning, [id number]- Aquatic Therapy, [phone]-  Electrical stimulation (manual), [phone] (1-2 muscles), 20561 (3+ muscles)- Dry Needling, Patient/Family education, Balance training, Stair training, Taping, Joint mobilization, Spinal mobilization, Vestibular training, DME instructions, Cryotherapy, and Moist heat  PLAN FOR NEXT SESSION: Discharge this session.      Greig Anon, PT 02/27/2024 9:19 AM Phone: [phone] Fax: [phone]  [facility] at [facility] [address] [place], [address] [place], [place] [address] Phone # [phone] Fax # [phone]       "

## 2024-02-27 NOTE — Addendum Note (Signed)
 Addended by: STARLET GREIG ORN on: 02/27/2024 09:24 AM   Modules accepted: Orders

## 2024-02-27 NOTE — Therapy (Signed)
 " OUTPATIENT OCCUPATIONAL THERAPY NEURO Treatment Note  Patient Name: Veronica Webb MRN: [id number] DOB:[birth year], 83 y.o., female Today's Date: 02/27/2024  PCP: Dwight Trula SQUIBB, MD REFERRING PROVIDER: Whitfield Raisin, NP  END OF SESSION:  OT End of Session - 02/27/24 0805     Visit Number 5    Number of Visits 7    Date for Recertification  03/29/24    Authorization Type Medicare A&B    OT Start Time 0803    OT Stop Time 0837    OT Time Calculation (min) 34 min    Equipment Utilized During Treatment jenga, small pegs and peg board    Activity Tolerance Patient tolerated treatment well    Behavior During Therapy WFL for tasks assessed/performed              Past Medical History:  Diagnosis Date   Hypothyroidism    Past Surgical History:  Procedure Laterality Date   ABDOMINAL HYSTERECTOMY  1985   ovaries removed   COLONOSCOPY     COLONOSCOPY WITH PROPOFOL  N/A 01/29/2013   Procedure: COLONOSCOPY WITH PROPOFOL ;  Surgeon: Gladis MARLA Louder, MD;  Location: [facility];  Service: Endoscopy;  Laterality: N/A;   EYE SURGERY     cataract   Patient Active Problem List   Diagnosis Date Noted   Acute ischemic stroke (HCC) 09/21/2022    ONSET DATE: 12/21/23 referral date, 09/21/2022 stroke date (was hospitalized 07/24-07/26/2024)  REFERRING DIAG: M25.511,G89.29 (ICD-10-CM) - Chronic right shoulder pain Add'l note from referring provider: Post stroke right shoulder pain and decreased R hand dexterity  THERAPY DIAG:  Chronic right shoulder pain - Plan: Ot plan of care cert/re-cert  Other lack of coordination - Plan: Ot plan of care cert/re-cert  Other disturbances of skin sensation - Plan: Ot plan of care cert/re-cert  Muscle weakness (generalized) - Plan: Ot plan of care cert/re-cert  Rationale for Evaluation and Treatment: Rehabilitation  SUBJECTIVE:   SUBJECTIVE STATEMENT: Pt reports there are good days and bad days.  Life has been too busy.  Pt reports  inconsistency with going up/down stairs.    Pt accompanied by: self  PERTINENT HISTORY: acute ischemic stroke, hypothyroidism, cataract surgery, macular pin  PRECAUTIONS: None  WEIGHT BEARING RESTRICTIONS: No  PAIN:  Are you having pain? Yes: NPRS scale: 2/10 Pain location: R shoulder Pain description: tingling up the arm and contracting in shoulder Aggravating factors: excessive movement Relieving factors: rest   FALLS: Has patient fallen in last 6 months? No  LIVING ENVIRONMENT: Lives with: lives with their spouse Lives in: House/apartment [facility] Stairs: No Has following equipment at home: Tour manager, Grab bars, and walking stick  PLOF: Independent and Leisure: was doing water aerobics and walking  PATIENT GOALS: I would like to not have this tingling.   OBJECTIVE:  Note: Objective measures were completed at Evaluation unless otherwise noted.  HAND DOMINANCE: Right  ADLs: Overall ADLs: Independent  IADLs: Independent with all IADL, does not cook but can if needed. Gets meals at ILF   MOBILITY STATUS: Independent  POSTURE COMMENTS:  No Significant postural limitations   ACTIVITY TOLERANCE: Activity tolerance: no concerns noted at this time  FUNCTIONAL OUTCOME MEASURES: Upper Extremity Functional Scale (UEFS): 73/80  02/27/24: UEFS 73/80  UPPER EXTREMITY ROM:  WNL  Active ROM Right eval Left eval  Shoulder flexion    Shoulder abduction    Shoulder adduction    Shoulder extension    Shoulder internal rotation    Shoulder external rotation  Elbow flexion    Elbow extension    Wrist flexion    Wrist extension    Wrist ulnar deviation    Wrist radial deviation    Wrist pronation    Wrist supination    (Blank rows = not tested)  UPPER EXTREMITY MMT:   4/5 grossly   HAND FUNCTION: Grip strength: Right: 30 lbs; Left: 25.67 lbs  COORDINATION: 9 Hole Peg test: Right: 23.70 sec; Left: 22.59 sec Box and Blocks:  Right 50 blocks, Left  55blocks  SENSATION:Reports tingling in RUE (specifically the hand) and that it travels to the L sometimes   COGNITION: Overall cognitive status: Within functional limits for tasks assessed  VISION: Subjective report: Pt reports that she had difficulty reading when she had the stroke, but that it is fine now. Baseline vision: Wears glasses all the time Visual history: cataracts, macular pinch  VISION ASSESSMENT: Not tested  Patient has difficulty with following activities due to following visual impairments: none  PERCEPTION: Not tested  PRAXIS: Not tested  OBSERVATIONS: Decreased dexterity/FM coordination in R hand, sensation concerns in R hand chiefly (with occasional sensation concerns traveling to LUE), occasional pain in RUE as well                                                                                                                              TREATMENT DATE:  02/27/24 Re-assessed pt goals and measurements, see Objectives and Goals for detailed information.  Desensitization strategies/techniques: OT instructed pt in desensitization strategies as pt still with impaired sensation to touch and decreased sensation of items in hand.  Pt educated on rubbing of hand and finger tips with soft textures, progressing to rougher textures as tolerated.  OT then educating pt on progressive textures to attempt as tolerance increases.   Educated patient in Safety considerations for loss of sensation as noted in patient instructions to reduce risk of injury to affected hand.  Pt encouraged to be careful of sharp, hot/cold (check temperatures of water), breakable, heavy objects and chemicals. Patient verbalized understanding. Handouts provided.      02/07/24 Self care training completed for duration below including the following: Pt reports she has not completed supine exercises, educated in purpose of gravity eliminated exercises for improved partiicpatoin and reduced pain with  shoulder abduction.  Educated in proper use of heat and cold modalities including safety precautions. See Pt instructions for handouts provided. Therapeutic activities completed for duration below including the following:  Patient used R hand to assemble small Jenga tower, and then removed approximately pieces individually until the tower fell over for fine motor coordination of affected extremity.  Next pt utilized tip to tip pinch of R hand recreating pattern on pegboard with small pegs. Therapeutic exercise completed for duration below including the following:  Pt instructed in R shoulder isometrics to improve strength for improved functional use. Pt provided visual demo and verbal instruction and handout for completion at home. Please  see Pt instructions for details. Pt denied any pain with these, instructed to stop should undue pain occur. 01/30/24 Clothespins: Resistance Clothespins 2,4,6# with RUE for high functional reaching (shoulder flexion) and sustained pinch. Pt demonstrating good flexion overhead, however reporting fatigue with pinch and soreness in biceps after repetitive reaching.  Shoulder ROM: attempted shoulder ROM with yellow theraband with pt tolerating shoulder flexion, however reporting pain with attempts at abduction.  OT downgraded task to have pt complete ROM in supine to decrease effects of gravity on movement.  Engaged in shoulder flexion, abduction, and internal/external rotation in supine.  OT providing education on technique and positioning to increase ROM and ease.  Pt completed each x10.  Provided with handout.    01/16/24 Coordination:  OT instructing in various coordination tasks with household items.  Provided with HEP with pictures for carryover to home. Picking up various small items with R hand.  Pt reporting tingling in hand impacting ease. Stacking coins and unstacking Rotating coins in finger tips Picking up 5-10 coins 1 at a time and translating palm to  fingertips to place into coin slot Rotating golf balls in palm of hand clockwise Sensation: OT educating on re-sensitization strategies and providing education on the sensory and motor pathways in the brain.  OT educating on use of vision to compensate for impaired sensation.  OT providing with handout         PATIENT EDUCATION: Education details: Isometrics HEP Person educated: Patient Education method: Programmer, Multimedia, Facilities Manager, and Handouts Education comprehension: verbalized understanding, returned demonstration, and needs further education  HOME EXERCISE PROGRAM: Access Code: [email or web address] URL: [email or web address] Date: 01/30/2024 Prepared by: [facility]  Re[facility] - [facility]  Exercises - Seated Shoulder Blade Squeeze   - Seated Shoulder Rolls   - Supine Shoulder Flexion Extension Full Range AROM  - Shoulder Abduction Adduction   - Single Arm Shoulder External Rotation    02/07/24 Access Code: [email or web address] URL: [email or web address] Date: 02/07/2024  Exercises - Seated Isometric Shoulder Flexion  - 1 x daily - 4-5 x weekly - 3 sets - 5 reps - 3-5 hold - Seated Isometric Shoulder Extension  - 1 x daily - 4-5 x weekly - 3 sets - 5 reps - 3-5 hold - Seated Isometric Shoulder External Rotation with Towel  - 1 x daily - 4-5 x weekly - 3 sets - 5 reps - 3-5 hold - Seated Isometric Shoulder Internal Rotation with Towel  - 1 x daily - 4-5 x weekly - 3 sets - 5 reps - 3-5 hold - Standing Isometric Shoulder Abduction with Doorway - Arm Bent  - 1 x daily - 4-5 x weekly - 3 sets - 5 reps - 3-5 hold  GOALS: Goals reviewed with patient? Yes  SHORT TERM GOALS: Target date: 02/16/24  Pt will be independent with HEPs for UE strength and coordination Baseline: New to OP OT Goal status:  in progress  2.  Pt will be educated in AE to improve ability to complete tasks such as opening jars Baseline: New to OP OT Goal status: in progress  3.   Pt will be educated in modality use to reduce pain prn Baseline: New to OP OT Goal status:  in progress    LONG TERM GOALS: Target date: 02/23/24  Pt will demonstrate improved function by a score of at least 75/80 on UEFS Baseline: 73/80 02/27/24: 73/80 Goal status:  in progress  2.  Pt will be able to place at least 55 blocks  using right hand with completion of Box and Blocks test.  Baseline:  Right 50 blocks, Left 55blocks 02/27/24: 54 blocks Goal status:  in progress  3.  Patient will demo improved FM coordination as evidenced by completing nine-hole peg with use of R hand in 21 seconds or less.  Baseline: Right: 23.70 sec; Left: 22.59 sec 02/27/24: Right: 21.85 sec Goal status:  in progress  LONG TERM GOALS: Target date: 03/29/24  Pt will demonstrate improved function by a score of at least 75/80 on UEFS Baseline: 73/80 02/27/24: 73/80 Goal status:  in progress  2.  Pt will be able to place at least 55 blocks using right hand with completion of Box and Blocks test.  Baseline:  Right 50 blocks, Left 55blocks 02/27/24: 54 blocks Goal status:  in progress  3.  Patient will demo improved FM coordination as evidenced by completing nine-hole peg with use of R hand in 21 seconds or less.  Baseline: Right: 23.70 sec; Left: 22.59 sec 02/27/24: Right: 21.85 sec Goal status:  in progress   ASSESSMENT:  CLINICAL IMPRESSION: Patient is a 83 y.o. female who was seen today for occupational therapy treatment for RUE weakness and pain. Hx includes acute ischemic stroke, cataract surgery, hypothyroidism. Pt demonstrating improvements in coordination in both 9 hole peg test and box and blocks assessments, however not quite meeting goals.  Pt educated on re/desensitization strategies and encouraged to continue use of UE during routine, functional tasks.  Pt feeling pleased with current status, however expressing desire to focus on HEP and to return PRN if concerns continue.  Will keep  episode open ~30 days for PRN return and then will d/c afterwards if not further needs - pt in agreement.   PERFORMANCE DEFICITS: in functional skills including IADLs, coordination, dexterity, sensation, pain, Fine motor control, and UE functional use, cognitive skills including safety awareness, and psychosocial skills including coping strategies and environmental adaptation.    PLAN:  OT FREQUENCY: 1x/week  OT DURATION: 6 weeks  PLANNED INTERVENTIONS: 97168 OT Re-evaluation, 97535 self care/ADL training, 02889 therapeutic exercise, 97530 therapeutic activity, 97112 neuromuscular re-education, 97140 manual therapy, passive range of motion, energy conservation, coping strategies training, patient/family education, and DME and/or AE instructions  RECOMMENDED OTHER SERVICES: none  CONSULTED AND AGREED WITH PLAN OF CARE: Patient  PLAN FOR NEXT SESSION: F/u isometric shoulder HEP Review shoulder HEP F/u sleep positioning Coordination activities Sensation loss mgmt  Veronica Webb, OTR/L 02/27/2024, 8:45 AM  [facility] [address], [address] [place], [place] [address] Phone # [phone] Fax # [phone]          "

## 2024-02-27 NOTE — Patient Instructions (Signed)
 Re/desensitization Techniques Some ways to Re/desensitize a hyper or hyposensitive ie) numb, tingling limb/area is by rubbing it with different textures. This will make your limb more tolerant/aware of touch and pressure. Before you begin, make sure your hands and the materials you're using are clean.  To rub your painful/sensitive area with different textures: Sit in a comfortable position with the numb/painful/sensitive area uncovered. Start with a material that is soft, such as a cotton ball, silk or soft towel. Rub your painful/sensitive area in all directions. Start with a light pressure and gradually increase the pressure as tolerated.  Vary the textures you use, as you can tolerate them. Start with soft materials like cotton balls or a makeup brush. Progress to materials that are rougher. Examples include a paper towel, cloth towel, wool, or velcro. As you progress, gradually increase the pressure and roughness of the texture you use. Be careful not to rub over any incisions or wounds, if you still have staples or sutures in place, or if there are any open areas. Rub your limb for at least 30 seconds progressing up to a minute or two as often as you can tolerate, or as recommended by your healthcare provider. Be careful not to irritate your skin or rub your skin raw.  Stop rubbing your affected area if you notice redness that does not dissipate in 15-30 minutes, bleeding or opening skin, and contact your healthcare provider.   Other methods for re/desensitization include: Allow cool or warm water to run over the area.  Be careful not to get the water too hot, especially if you have decreases sensation of temperature. Putting your affected area into a bowl of dry rice, sand, kidney beans, cold water or warm water. You can hide objects in the dry materials to find. Make a bag of miscellaneous matching objects to feel and match based on feel ie) paper clips, nuts, bolts, dice, marbles, checkers,  dominos, beads etc.   If you choose to start with the method of dipping your affected area into a medium such as rice, sand, or water make sure to move the affected area around in the bowl until you cannot tolerate it or you reach one to two minutes, whichever comes first. You can use a combination of these methods for 10 to 15 minutes, 3-4 times per day.    Safety considerations for loss of sensation:    Look at affected hand when using it!    Do NOT use affected arm for anything: sharp, hot, breakable, or too heavy   Always check temperature of water (for showering, washing dishes, etc) with UNaffected arm/extremity   Consider travel mugs w/ lids to transport hot liquids/coffee   Consider alternative options and/or adaptive equipment to make things safer (ex: hand chopper or cut resistant glove for chopping vegetables)    Avoid cold temperatures as well (wear glove in cold temperatures, get ice w/ unaffected extremity)   AVOID handling chemicals and machinery

## 2024-03-05 ENCOUNTER — Ambulatory Visit: Admitting: Occupational Therapy
